# Patient Record
Sex: Female | Born: 1935 | Race: White | Hispanic: No | Smoking: Never smoker
Health system: Southern US, Community
[De-identification: ages and names within clinical notes are randomized; demographics above are authoritative.]

## PROBLEM LIST (undated history)

## (undated) DIAGNOSIS — E785 Hyperlipidemia, unspecified: Secondary | ICD-10-CM

## (undated) DIAGNOSIS — E079 Disorder of thyroid, unspecified: Secondary | ICD-10-CM

## (undated) DIAGNOSIS — J45909 Unspecified asthma, uncomplicated: Secondary | ICD-10-CM

## (undated) DIAGNOSIS — C50012 Malignant neoplasm of nipple and areola, left female breast: Secondary | ICD-10-CM

## (undated) HISTORY — DX: Malignant neoplasm of nipple and areola, left female breast: C50.012

## (undated) HISTORY — DX: Hyperlipidemia, unspecified: E78.5

## (undated) HISTORY — DX: Disorder of thyroid, unspecified: E07.9

---

## 1978-01-12 HISTORY — PX: MASTECTOMY: SHX3

## 2000-01-13 HISTORY — PX: INTRAOCULAR LENS IMPLANT, SECONDARY: SHX1842

## 2010-05-13 HISTORY — PX: INTERSTIM IMPLANT PLACEMENT: SHX5130

## 2010-05-13 HISTORY — PX: OTHER SURGICAL HISTORY: SHX169

## 2018-11-22 LAB — CBC AND DIFFERENTIAL
HCT: 37 (ref 36–46)
Hemoglobin: 12 (ref 12.0–16.0)
Platelets: 191 (ref 150–399)
WBC: 5.1

## 2018-11-22 LAB — LIPID PANEL
Cholesterol: 160 (ref 0–200)
HDL: 69 (ref 35–70)
LDL Cholesterol: 82
Triglycerides: 44 (ref 40–160)

## 2018-11-22 LAB — BASIC METABOLIC PANEL
BUN: 34 — AB (ref 4–21)
CO2: 24 — AB (ref 13–22)
Chloride: 110 — AB (ref 99–108)
Creatinine: 0.9 (ref 0.5–1.1)
Glucose: 70
Potassium: 4.5 (ref 3.4–5.3)
Sodium: 144 (ref 137–147)

## 2018-11-22 LAB — COMPREHENSIVE METABOLIC PANEL
Albumin: 3.5 (ref 3.5–5.0)
Calcium: 9.5 (ref 8.7–10.7)
GFR calc non Af Amer: 57

## 2018-11-22 LAB — CBC: RBC: 4.03 (ref 3.87–5.11)

## 2018-11-22 LAB — TSH: TSH: 2.27 (ref 0.41–5.90)

## 2019-07-20 ENCOUNTER — Telehealth: Payer: Self-pay

## 2019-07-20 NOTE — Telephone Encounter (Signed)
Told pt to clean around area with soft soap. Pt verbalized understanding.  KP

## 2019-07-20 NOTE — Telephone Encounter (Signed)
Copied from [place] [id number]. Topic: General - Inquiry >> Jul 20, 2019  9:14 AM Mathis Bud wrote: Reason for CRM: [name] patient daughter has a new patient appt 7/20.  Patient is requesting a new patient appt for today due to her catheter having blood in it.  Call back [phone]   Explained to care giver that we do not have a new pt appt today & that if there is an issue with the catheter then the ER may be her best bet.  She said she thinks she is fine & was wondering if she can clean the area in the abdomen where the catheter is with peroxide.

## 2019-07-31 ENCOUNTER — Other Ambulatory Visit: Payer: Self-pay

## 2019-07-31 ENCOUNTER — Telehealth: Payer: Self-pay

## 2019-07-31 DIAGNOSIS — D649 Anemia, unspecified: Secondary | ICD-10-CM | POA: Insufficient documentation

## 2019-07-31 DIAGNOSIS — E0590 Thyrotoxicosis, unspecified without thyrotoxic crisis or storm: Secondary | ICD-10-CM

## 2019-07-31 DIAGNOSIS — R339 Retention of urine, unspecified: Secondary | ICD-10-CM | POA: Insufficient documentation

## 2019-07-31 DIAGNOSIS — Z9359 Other cystostomy status: Secondary | ICD-10-CM | POA: Insufficient documentation

## 2019-07-31 DIAGNOSIS — J42 Unspecified chronic bronchitis: Secondary | ICD-10-CM | POA: Insufficient documentation

## 2019-07-31 DIAGNOSIS — K22719 Barrett's esophagus with dysplasia, unspecified: Secondary | ICD-10-CM

## 2019-07-31 DIAGNOSIS — E782 Mixed hyperlipidemia: Secondary | ICD-10-CM | POA: Insufficient documentation

## 2019-07-31 DIAGNOSIS — R413 Other amnesia: Secondary | ICD-10-CM

## 2019-07-31 DIAGNOSIS — I10 Essential (primary) hypertension: Secondary | ICD-10-CM | POA: Insufficient documentation

## 2019-07-31 DIAGNOSIS — E039 Hypothyroidism, unspecified: Secondary | ICD-10-CM | POA: Insufficient documentation

## 2019-07-31 DIAGNOSIS — E871 Hypo-osmolality and hyponatremia: Secondary | ICD-10-CM | POA: Insufficient documentation

## 2019-07-31 DIAGNOSIS — K2270 Barrett's esophagus without dysplasia: Secondary | ICD-10-CM | POA: Insufficient documentation

## 2019-07-31 DIAGNOSIS — E785 Hyperlipidemia, unspecified: Secondary | ICD-10-CM

## 2019-07-31 DIAGNOSIS — K219 Gastro-esophageal reflux disease without esophagitis: Secondary | ICD-10-CM | POA: Insufficient documentation

## 2019-07-31 NOTE — Telephone Encounter (Signed)
Called and spoke with pts legal guardian. Entered Allergies, Meds, and History before pt new patient appt.   KP

## 2019-08-01 ENCOUNTER — Ambulatory Visit (INDEPENDENT_AMBULATORY_CARE_PROVIDER_SITE_OTHER): Payer: Medicare Other | Admitting: Internal Medicine

## 2019-08-01 ENCOUNTER — Other Ambulatory Visit: Payer: Self-pay

## 2019-08-01 ENCOUNTER — Encounter: Payer: Self-pay | Admitting: Internal Medicine

## 2019-08-01 VITALS — BP 118/78 | HR 93 | Temp 98.2°F | Ht 63.0 in | Wt 149.0 lb

## 2019-08-01 DIAGNOSIS — F0390 Unspecified dementia without behavioral disturbance: Secondary | ICD-10-CM | POA: Insufficient documentation

## 2019-08-01 DIAGNOSIS — Z7189 Other specified counseling: Secondary | ICD-10-CM

## 2019-08-01 DIAGNOSIS — G301 Alzheimer's disease with late onset: Secondary | ICD-10-CM | POA: Diagnosis not present

## 2019-08-01 DIAGNOSIS — Z9359 Other cystostomy status: Secondary | ICD-10-CM

## 2019-08-01 DIAGNOSIS — R339 Retention of urine, unspecified: Secondary | ICD-10-CM

## 2019-08-01 DIAGNOSIS — F0280 Dementia in other diseases classified elsewhere without behavioral disturbance: Secondary | ICD-10-CM

## 2019-08-01 DIAGNOSIS — J410 Simple chronic bronchitis: Secondary | ICD-10-CM | POA: Diagnosis not present

## 2019-08-01 DIAGNOSIS — E039 Hypothyroidism, unspecified: Secondary | ICD-10-CM

## 2019-08-01 DIAGNOSIS — E785 Hyperlipidemia, unspecified: Secondary | ICD-10-CM

## 2019-08-01 DIAGNOSIS — K5900 Constipation, unspecified: Secondary | ICD-10-CM

## 2019-08-01 DIAGNOSIS — I10 Essential (primary) hypertension: Secondary | ICD-10-CM

## 2019-08-01 NOTE — Progress Notes (Signed)
Date:  08/01/2019   Name:  Morgan Schaefer   DOB:  November 12, 1925   MRN:  [id number]   Chief Complaint: Constipation, Suprapubic catheter (Needs urology referral. ), and FL2 (Needs FL2 form for [facility]. Stays with Legal Gardian. Going temp while Legal Gardian goes to [place]. )  Constipation This is a chronic (POA believes that she has had issues her whole life) problem. The problem has been waxing and waning since onset. Her stool frequency is 1 time per day (as long as she takes something.). The stool is described as formed. The patient is on a high fiber diet. She exercises regularly (recently started walking more often). There has been adequate water intake. Associated symptoms include fecal incontinence. Pertinent negatives include no abdominal pain, bloating, diarrhea, fever, melena or vomiting. Risk factors include immobility. Treatments tried: currently using Miralax.  Hypertension This is a chronic problem. The problem is controlled. Pertinent negatives include no chest pain, headaches or shortness of breath. Past treatments include calcium channel blockers. There are no compliance problems.  There is no history of kidney disease, CAD/MI, CVA or heart failure.  Hyperlipidemia This is a chronic problem. The problem is controlled. Pertinent negatives include no chest pain or shortness of breath. Current antihyperlipidemic treatment includes statins. The current treatment provides significant improvement of lipids.  Urinary Frequency  This is a chronic (urinary retention) problem. Associated symptoms include frequency. Pertinent negatives include no vomiting. Treatments tried: Interstim placed in 2014.  Suprapubic cath placed 4 months ago.  Dementia - determined to be Alzheimers dementia.  Patient is independent with dressing and feeding but needs assistance to bathe.  She is pleasant and cooperative, verbally interactive with no behavior issues.  Living with her POA now in  [place], moved from [facility].  She only has one relative - an 84 y/o step son in poor health out of state.  She has a DNR form from 2018 in [place].  Needs a new one for Jay.  Lab Results  Component Value Date   WBC 5.1 11/22/2018   HGB 12.0 11/22/2018   HCT 37 11/22/2018   PLT 191 11/22/2018   Lab Results  Component Value Date   CREATININE 0.9 11/22/2018   BUN 34 (A) 11/22/2018   NA 144 11/22/2018   K 4.5 11/22/2018   CL 110 (A) 11/22/2018   CO2 24 (A) 11/22/2018   Lab Results  Component Value Date   CHOL 160 11/22/2018   HDL 69 11/22/2018   LDLCALC 82 11/22/2018   TRIG 44 11/22/2018   Lab Results  Component Value Date   TSH 2.27 11/22/2018      Review of Systems  Constitutional: Negative for appetite change, diaphoresis, fever and unexpected weight change.  Respiratory: Positive for cough and wheezing (occasional). Negative for chest tightness and shortness of breath.   Cardiovascular: Negative for chest pain and leg swelling.  Gastrointestinal: Positive for constipation. Negative for abdominal pain, bloating, diarrhea, melena and vomiting.  Genitourinary: Positive for frequency.       Urinary incontinence  Musculoskeletal: Positive for gait problem (uses rolator for balance). Negative for arthralgias.  Skin: Negative for color change and rash.  Allergic/Immunologic: Positive for environmental allergies.  Neurological: Negative for dizziness, light-headedness and headaches.  Psychiatric/Behavioral: Positive for confusion. Negative for dysphoric mood and sleep disturbance. The patient is not nervous/anxious.     Patient Active Problem List   Diagnosis Date Noted   Dementia ([place]) 08/01/2019   Benign essential HTN 07/31/2019  Hyperlipidemia, mixed 07/31/2019   Hypothyroidism (acquired) 07/31/2019   Urinary retention 07/31/2019   Suprapubic catheter ([place]) 07/31/2019   Hyponatremia 07/31/2019   Barrett's esophagus 07/31/2019   Anemia 07/31/2019   Memory loss 07/31/2019    Chronic bronchitis ([place]) 07/31/2019   GERD (gastroesophageal reflux disease) 07/31/2019    Allergies  Allergen Reactions   Ciprofloxacin Other (See Comments)    Unkown   Penicillins Other (See Comments)    unknown   Sulfa Antibiotics Other (See Comments)    UNKNOWN    Past Surgical History:  Procedure Laterality Date   INTERSTIM IMPLANT PLACEMENT  05/2010   INTRAOCULAR LENS IMPLANT, SECONDARY  2002   MASTECTOMY Right 1980    Social History   Tobacco Use   Smoking status: Never Smoker   Smokeless tobacco: Never Used  Vaping Use   Vaping Use: Never used  Substance Use Topics   Alcohol use: Not Currently   Drug use: Not Currently     Medication list has been reviewed and updated.  Current Meds  Medication Sig   amLODipine (NORVASC) 5 MG tablet Take 5 mg by mouth daily.   aspirin EC 81 MG tablet Take 81 mg by mouth daily. Swallow whole.   fluticasone-salmeterol (ADVAIR HFA) 115-21 MCG/ACT inhaler Inhale 2 puffs into the lungs 2 (two) times daily.   guaiFENesin (MUCINEX) 600 MG 12 hr tablet Take 1 tablet by mouth 2 (two) times daily as needed.    levothyroxine (SYNTHROID) 25 MCG tablet Take 25 mcg by mouth daily before breakfast.   loratadine (CLARITIN) 10 MG tablet Take 10 mg by mouth daily.   montelukast (SINGULAIR) 10 MG tablet Take 10 mg by mouth at bedtime.   Multiple Vitamins-Minerals (MULTIVITAMINS THER. W/MINERALS) TABS tablet Take 1 tablet by mouth daily.   Multiple Vitamins-Minerals (OCUVITE ADULT 50+ PO) Take 1 capsule by mouth daily.   nystatin cream (MYCOSTATIN) Apply 1 application topically 2 (two) times daily.   pantoprazole (PROTONIX) 40 MG tablet Take 40 mg by mouth daily.   polyethylene glycol (MIRALAX / GLYCOLAX) 17 g packet Take 17 g by mouth 3 (three) times a week.   saccharomyces boulardii (FLORASTOR) 250 MG capsule Take 250 mg by mouth 2 (two) times daily.   simvastatin (ZOCOR) 20 MG tablet Take 20 mg by mouth daily.      PHQ 2/9 Scores 08/01/2019  PHQ - 2 Score 0  PHQ- 9 Score 0    GAD 7 : Generalized Anxiety Score 08/01/2019  Nervous, Anxious, on Edge 0  Control/stop worrying 0  Worry too much - different things 0  Trouble relaxing 0  Restless 0  Easily annoyed or irritable 0  Afraid - awful might happen 0  Total GAD 7 Score 0  Anxiety Difficulty Not difficult at all    BP Readings from Last 3 Encounters:  08/01/19 118/78    Physical Exam Vitals and nursing note reviewed.  Constitutional:      General: She is not in acute distress.    Appearance: Normal appearance. She is well-developed.  HENT:     Head: Normocephalic and atraumatic.     Right Ear: Decreased hearing noted.     Left Ear: Decreased hearing noted.  Neck:     Thyroid: No thyromegaly or thyroid tenderness.     Vascular: No carotid bruit.  Cardiovascular:     Rate and Rhythm: Normal rate and regular rhythm. Occasional extrasystoles are present.    Pulses:  Radial pulses are 1+ on the right side and 1+ on the left side.       Dorsalis pedis pulses are 1+ on the right side and 1+ on the left side.     Heart sounds: Normal heart sounds. No murmur heard.   Pulmonary:     Effort: Pulmonary effort is normal. No respiratory distress.     Breath sounds: Normal breath sounds and air entry. No decreased breath sounds, wheezing or rhonchi.  Abdominal:     General: Abdomen is flat. Bowel sounds are normal.     Palpations: Abdomen is soft.       Comments: Suprapubic cath draining clear yellow urine  Musculoskeletal:        General: Normal range of motion.     Cervical back: Normal range of motion.     Right hip: Normal.     Left hip: Normal.     Right knee: Normal.     Left knee: Normal.     Right lower leg: No edema.     Left lower leg: No edema.  Lymphadenopathy:     Cervical: No cervical adenopathy.  Skin:    General: Skin is warm and dry.     Capillary Refill: Capillary refill takes less than 2 seconds.      Findings: No rash.  Neurological:     General: No focal deficit present.     Mental Status: She is alert.     Sensory: Sensation is intact.     Gait: Gait abnormal (uses rolator).  Psychiatric:        Attention and Perception: Attention normal.        Mood and Affect: Mood normal.        Speech: Speech normal.        Cognition and Memory: Memory is impaired.     Wt Readings from Last 3 Encounters:  08/01/19 149 lb (67.6 kg)    BP 118/78    Pulse 93    Temp 98.2 F (36.8 C) (Oral)    Ht 5\' 3"  (1.6 m)    Wt 149 lb (67.6 kg)    SpO2 93%    BMI 26.39 kg/m   Assessment and Plan: 1. Benign essential HTN Clinically stable exam with well controlled BP. Tolerating medications without side effects at this time. Pt to continue current regimen and low sodium diet; benefits of regular exercise as able discussed. - CBC with Differential/Platelet - Comprehensive metabolic panel  2. Suprapubic catheter [facility]) Site is clear.  Continue local care and refer to Urology - Ambulatory referral to Urology  3. Simple chronic bronchitis (HCC) Pt is on an appropriate regimen with minimal symptoms Will continue Advair, mucinex, Claritin and singulair  4. Late onset Alzheimer's disease without behavioral disturbance [facility]) Now living with her POA and friend [place] Needs respite for a vacation in August - FL2 for [facility] completed today  5. Hypothyroidism (acquired) Supplemented; check labs and advise - TSH + free T4  6. Urinary retention S/p Interstim placement and recently suprapubic catheter - Ambulatory referral to Urology  7. DNR (do not resuscitate) discussion When asked about DNR/rescusitation patient stated "what for?" Previous form reviewed. New DNR for [place] completed  8. Hyperlipidemia, unspecified hyperlipidemia type - Lipid panel  9. Constipation, unspecified constipation type Continue daily Miralax - adjust dose to achieve 1-2 soft stools per day   Partially dictated  using Editor, commissioning. Any errors are unintentional.  Halina Maidens, MD [facility]  08/01/2019

## 2019-08-02 LAB — CBC WITH DIFFERENTIAL/PLATELET
Basophils Absolute: 0 10*3/uL (ref 0.0–0.2)
Basos: 0 %
EOS (ABSOLUTE): 0.2 10*3/uL (ref 0.0–0.4)
Eos: 2 %
Hematocrit: 42.3 % (ref 34.0–46.6)
Hemoglobin: 14.3 g/dL (ref 11.1–15.9)
Immature Grans (Abs): 0 10*3/uL (ref 0.0–0.1)
Immature Granulocytes: 0 %
Lymphocytes Absolute: 1.6 10*3/uL (ref 0.7–3.1)
Lymphs: 21 %
MCH: 29.4 pg (ref 26.6–33.0)
MCHC: 33.8 g/dL (ref 31.5–35.7)
MCV: 87 fL (ref 79–97)
Monocytes Absolute: 0.5 10*3/uL (ref 0.1–0.9)
Monocytes: 6 %
Neutrophils Absolute: 5.4 10*3/uL (ref 1.4–7.0)
Neutrophils: 71 %
Platelets: 267 10*3/uL (ref 150–450)
RBC: 4.86 x10E6/uL (ref 3.77–5.28)
RDW: 13.3 % (ref 11.7–15.4)
WBC: 7.8 10*3/uL (ref 3.4–10.8)

## 2019-08-02 LAB — COMPREHENSIVE METABOLIC PANEL
ALT: 10 IU/L (ref 0–32)
AST: 14 IU/L (ref 0–40)
Albumin/Globulin Ratio: 1.9 (ref 1.2–2.2)
Albumin: 4.5 g/dL (ref 3.5–4.6)
Alkaline Phosphatase: 83 IU/L (ref 48–121)
BUN/Creatinine Ratio: 18 (ref 12–28)
BUN: 19 mg/dL (ref 10–36)
Bilirubin Total: 0.3 mg/dL (ref 0.0–1.2)
CO2: 24 mmol/L (ref 20–29)
Calcium: 10 mg/dL (ref 8.7–10.3)
Chloride: 102 mmol/L (ref 96–106)
Creatinine, Ser: 1.03 mg/dL — ABNORMAL HIGH (ref 0.57–1.00)
GFR calc Af Amer: 55 mL/min/{1.73_m2} — ABNORMAL LOW (ref >59)
GFR calc non Af Amer: 48 mL/min/{1.73_m2} — ABNORMAL LOW (ref >59)
Globulin, Total: 2.4 g/dL (ref 1.5–4.5)
Glucose: 89 mg/dL (ref 65–99)
Potassium: 4.7 mmol/L (ref 3.5–5.2)
Sodium: 140 mmol/L (ref 134–144)
Total Protein: 6.9 g/dL (ref 6.0–8.5)

## 2019-08-02 LAB — LIPID PANEL
Chol/HDL Ratio: 2.6 ratio (ref 0.0–4.4)
Cholesterol, Total: 199 mg/dL (ref 100–199)
HDL: 78 mg/dL (ref >39)
LDL Chol Calc (NIH): 94 mg/dL (ref 0–99)
Triglycerides: 159 mg/dL — ABNORMAL HIGH (ref 0–149)
VLDL Cholesterol Cal: 27 mg/dL (ref 5–40)

## 2019-08-02 LAB — TSH+FREE T4
Free T4: 1.44 ng/dL (ref 0.82–1.77)
TSH: 3.83 u[IU]/mL (ref 0.450–4.500)

## 2019-08-08 ENCOUNTER — Encounter: Payer: Self-pay | Admitting: Urology

## 2019-08-08 ENCOUNTER — Other Ambulatory Visit: Payer: Self-pay

## 2019-08-08 ENCOUNTER — Ambulatory Visit (INDEPENDENT_AMBULATORY_CARE_PROVIDER_SITE_OTHER): Payer: Medicare Other | Admitting: Urology

## 2019-08-08 VITALS — BP 128/78 | HR 78 | Ht 63.0 in | Wt 148.0 lb

## 2019-08-08 DIAGNOSIS — R339 Retention of urine, unspecified: Secondary | ICD-10-CM | POA: Diagnosis not present

## 2019-08-08 NOTE — Progress Notes (Signed)
   08/08/19 3:27 PM   Morgan Schaefer 1925-09-06 [id number]  CC: Suprapubic tube/urinary retention  HPI: I saw Morgan Schaefer in urology clinic today for urinary retention managed with a suprapubic tube.  She is a 86 year old female with significant Alzheimer's disease who lives with her guardian Morgan Schaefer who is here with her today.  Her outside records are unavailable to me.  She reportedly had an InterStim placed in 2014 for unclear indications, and had been on CIC long-term for urinary retention.  She recently had a suprapubic tube placed in March 2021 in [place].  This was reportedly placed that she was having difficulty catheterizing and getting recurrent urinary infections secondary to nonsterile CIC technique.  Her suprapubic tube has apparently resolved all her infectious UTI issues.  This has been changed on a monthly basis by home health.  She is now lives in the area here, and they are being seen to establish care for SP tube changes.  She has minimal leakage from below, and is not bothered by any incontinence at this time.   Family History: Family History  Problem Relation Age of Onset  . Heart disease Mother   . Heart disease Father     Social History:  reports that she has never smoked. She has never used smokeless tobacco. She reports previous alcohol use. She reports previous drug use.  Physical Exam: BP 128/78   Pulse 78   Ht 5\' 3"  (1.6 m)   Wt 148 lb (67.1 kg)   BMI 26.22 kg/m    Constitutional: Pleasantly confused Cardiovascular: No clubbing, cyanosis, or edema. Respiratory: Normal respiratory effort, no increased work of breathing. GI: Abdomen is soft, nontender, nondistended, no abdominal masses  Procedure: The old suprapubic tube was removed.  The site was prepped and draped in standard sterile fashion, and a new 9 French catheter was advanced into the bladder with return of yellow urine, and 10 cc were placed in the balloon.  This was connected to a  new leg bag and secured to the left thigh.  Assessment & Plan:   In summary, she is a 86 year old female with reported chronic urinary retention previously managed by CIC, and transitioned to a suprapubic tube in March 2021 by an outside urologist in [facility].  They are presenting to establish care for suprapubic tube monitoring and care.  I changed her suprapubic tube today, and we discussed the need for ongoing monthly changes.  Return precautions were discussed at length.  RTC 1 month with PA for SP tube change  Nickolas Madrid, MD 08/08/2019  [facility] [address] [place], [place] [address] [phone]

## 2019-08-08 NOTE — Patient Instructions (Signed)
Suprapubic Catheter Home Guide °A suprapubic catheter is a flexible tube that is used to drain urine from the bladder into a collection bag outside the body. The catheter is inserted into the bladder through a small opening in the lower abdomen, above the pubic bone (suprapubic area) and a few inches below your belly button (navel). A tiny balloon filled with germ-free (sterile) water helps to keep the catheter in place. °The collection bag must be emptied at least once a day and cleaned at least every other day. The collection bag can be put beside your bed at night and attached to your leg during the day. You may have a large collection bag to use at night and a smaller one to use during the day. °Your suprapubic catheter may need to be changed every 4-6 weeks, or as often as recommended by your health care provider. Healing of the tract where the catheter is placed can take 6 weeks to 6 months. During that time, your health care provider may change your catheter. Once the tract is well healed, you or a caregiver will change your suprapubic catheter at home. °What are the risks? °This catheter is safe to use. However, problems can occur, including: °· Blocked urine flow. This can occur if the catheter stops working, or if you have a blood clot in your bladder or in the catheter. °· Irritation of the skin around the catheter. °· Infection. This can happen if bacteria gets into your bladder. °Supplies needed: °· Two pairs of sterile gloves. °· Paper towels. °· Catheter. °· Two syringes. °· Sterile water. °· Sterile cleaning solution. °· Lubricant. °· Collection bags. °How to change the catheter ° °1. Drink plenty of fluids during the hours before you change the catheter. °2. Wash your hands with soap and water. If soap and water are not available, use hand sanitizer. °3. Draw up sterile water into a syringe to have ready to fill the new catheter balloon. The amount will depend on the size of the balloon. °4. Have  all of your supplies ready and close to you on a paper towel. °5. Lie on your back, sitting slightly upright so that you can see the catheter and opening. °6. Put on sterile gloves. °7. Clean the skin around the catheter opening using the sterile cleaning solution. °8. Remove the water from the balloon in the catheter using a syringe. °9. Slowly remove the catheter. If the catheter seems stuck, or if you have difficulty removing it: °? Do not pull on it. °? Call your health care provider right away. °10. Place the old catheter on a paper towel to discard later. °11. Take off the used gloves, and put on a new pair. °12. Put lubricant on the end of the new catheter that will go into your bladder. °13. Clean the skin around the catheter opening using the sterile cleaning solution. °14. Gently slide the catheter through the opening in your abdomen and into the tract that leads to your bladder. °15. Wait for some urine to start flowing through the catheter. °16. When urine starts to flow through the catheter, attach the collection bag to the end of the catheter. Make sure the connection is tight. °17. Use a syringe to fill the catheter balloon with sterile water. Fill to the amount directed by your health care provider. °18. Remove the gloves and wash your hands with soap and water. °How to care for the skin around the catheter °Follow your health care provider's instructions on   caring for your skin. °· Use a clean washcloth and soapy water to clean the skin around your catheter every day. Pat the area dry with a clean paper towel. °· Do not pull on the catheter. °· Do not use ointment or lotion on this area, unless told by your health care provider. °· Check the skin around the catheter every day for signs of infection. Check for: °? Redness, swelling, or pain. °? Fluid or blood. °? Warmth. °? Pus or a bad smell. °How to empty and clean the collection bag °Empty the large collection bag every 8 hours. Empty the small  collection bag when it is about ? full. Clean the collection bag every 2-3 days, or as often as told by your health care provider. To do this: °1. Wash your hands with soap and water. If soap and water are not available, use hand sanitizer. °2. Disconnect the bag from the catheter and immediately attach a new bag to the catheter. °3. Hold the used bag over the toilet or another container. °4. Turn the valve (spigot) at the bottom of the bag to empty the urine. Empty the used bag completely. °? Do not touch the opening of the spigot. °? Do not let the opening touch the toilet or container. °5. Close the spigot tightly when the bag is empty. °6. Clean the used bag in one of the following methods: °? According to the manufacturer's instructions. °? As told by your health care provider. °7. Let the bag dry completely. Put it in a clean plastic bag before storing it. °General tips ° °· Always wash your hands before and after caring for your catheter and collection bag. Use a mild, fragrance-free soap. If soap and water are not available, use hand sanitizer. °· Clean the outside of the catheter with soap and water as often as told by your health care provider. °· Always make sure there are no twists or kinks in the catheter tube. °· Always make sure there are no leaks in the catheter or collection bag. °· Always wear the leg bag below your knee. °· Make sure the overnight drainage bag is always lower than the level of your bladder, but do not let it touch the floor. Before you go to sleep, hang the bag inside a wastebasket that is covered by a clean plastic bag. °· Drink enough fluid to keep your urine pale yellow. °· Do not take baths, swim, or use a hot tub until your health care provider approves. Ask your health care provider if you may take showers. °Contact a heath care provider if: °· You leak urine. °· You have redness, swelling, or pain around your catheter. °· You have fluid or blood coming from your catheter  opening. °· Your catheter opening feels warm to the touch. °· You have pus or a bad smell coming from your catheter opening. °· You have a fever or chills. °· Your urine flow slows down. °· Your urine becomes cloudy or smelly. °Get help right away if: °· Your catheter comes out. °· You have: °? Nausea. °? Back pain. °? Difficulty changing your catheter. °? Blood in your urine. °? No urine flow for 1 hour. °Summary °· A suprapubic catheter is a flexible tube that is used to drain urine from the bladder into a collection bag outside the body. °· Your suprapubic catheter may need to be changed every 4-6 weeks, or as recommended by your health care provider. °· Follow instructions on how to   change the catheter and how to empty and clean the collection bag. °· Always wash your hands before and after caring for your catheter and collection bag. Drink enough fluid to keep your urine pale yellow. °· Get help right away if you have difficulty changing your catheter or if there is blood in your urine. °This information is not intended to replace advice given to you by your health care provider. Make sure you discuss any questions you have with your health care provider. °Document Revised: 04/21/2018 Document Reviewed: 02/02/2018 °Elsevier Patient Education © 2020 [employer] Inc[employer] ° °

## 2019-08-09 ENCOUNTER — Telehealth: Payer: Self-pay | Admitting: Internal Medicine

## 2019-08-09 NOTE — Telephone Encounter (Unsigned)
Copied from [place] [id number]. Topic: General - Inquiry >> Aug 09, 2019  2:05 PM Scherrie Gerlach wrote: Reason for CRM: [name], [place] for the pt, states [facility] assisted living called her and said some changes need to be made on the FL2 1. Where is says resident level of care, needs to be ALF.  (not ASS) 2. Remove the nystatin cream (MYCOSTATIN) it was d'cd.  Please correct and send back to them.

## 2019-08-09 NOTE — Telephone Encounter (Signed)
Fixed the Fl2 form as stated above and refaxed.  CM

## 2019-08-17 ENCOUNTER — Ambulatory Visit
Admission: RE | Admit: 2019-08-17 | Discharge: 2019-08-17 | Disposition: A | Discharge status: Home / Self Care | Payer: Medicare Other | Source: Ambulatory Visit | Attending: Emergency Medicine | Admitting: Emergency Medicine

## 2019-08-17 ENCOUNTER — Other Ambulatory Visit: Payer: Self-pay

## 2019-08-17 DIAGNOSIS — Z111 Encounter for screening for respiratory tuberculosis: Secondary | ICD-10-CM

## 2019-08-17 MED ORDER — TUBERCULIN PPD 5 UNIT/0.1ML ID SOLN
5.0000 [IU] | Freq: Once | INTRADERMAL | Status: DC
  Administered 2019-08-17: 5 [IU] via INTRADERMAL

## 2019-08-17 NOTE — ED Triage Notes (Signed)
Patient states that she is here for a TB Skin test for nursing home placement.

## 2019-09-03 NOTE — Progress Notes (Signed)
Suprapubic Cath Change  Patient is present today for a suprapubic catheter change due to urinary retention with her guardian, [name].  9 ml of water was drained from the balloon, a 16 FR foley cath was removed from the tract with out difficulty.  Site was cleaned and prepped in a sterile fashion with betadine.  A 16 FR foley cath was replaced into the tract no complications were noted. Urine return was noted, 10 ml of sterile water was inflated into the balloon and a leg bag was attached for drainage.  Patient tolerated well.   Performed by: Zara Council, PA-C   Follow up: One month for SPT exchange

## 2019-09-04 ENCOUNTER — Encounter: Payer: Self-pay | Admitting: Urology

## 2019-09-04 ENCOUNTER — Ambulatory Visit (INDEPENDENT_AMBULATORY_CARE_PROVIDER_SITE_OTHER): Payer: Medicare Other | Admitting: Urology

## 2019-09-04 ENCOUNTER — Other Ambulatory Visit: Payer: Self-pay

## 2019-09-04 VITALS — BP 129/77 | HR 98 | Ht 63.0 in | Wt 146.0 lb

## 2019-09-04 DIAGNOSIS — R339 Retention of urine, unspecified: Secondary | ICD-10-CM

## 2019-09-20 ENCOUNTER — Other Ambulatory Visit: Payer: Self-pay

## 2019-09-20 ENCOUNTER — Encounter: Payer: Self-pay | Admitting: Urology

## 2019-09-20 ENCOUNTER — Ambulatory Visit (INDEPENDENT_AMBULATORY_CARE_PROVIDER_SITE_OTHER): Payer: Medicare Other | Admitting: Urology

## 2019-09-20 VITALS — BP 136/83 | HR 96 | Ht 63.0 in | Wt 140.0 lb

## 2019-09-20 DIAGNOSIS — R339 Retention of urine, unspecified: Secondary | ICD-10-CM | POA: Diagnosis not present

## 2019-09-20 NOTE — Progress Notes (Signed)
   09/20/2019 1:41 PM   Morgan Schaefer 06-21-1925 [id number]  Reason for visit: Suprapubic tube problem  HPI: I saw [name] and her caregiver Morgan Schaefer today as an add-on for a suprapubic tube problem.  She is a 86 year old female with significant Alzheimer's dementia who lives with her guardian Morgan Schaefer.  Her outside urology records are unavailable to me, but she apparently had been on CIC long-term for urinary retention, but a suprapubic tube was placed in March 2021 in [facility] as she was having difficulty catheterizing and getting recurrent infections due to nonsterile CIC technique.  The suprapubic tube has apparently resolved all her UTI issues.  The suprapubic tube was last changed by Zara Council on 09/04/2019 without issue.  Apparently yesterday, she accidentally pulled on her suprapubic tube and was having less output into the bag, as well as some leakage from her urethra.  Her guardian Morgan Schaefer was able to push it back in, and today it has been draining yellow urine into the bag fine.  PVR in clinic today 0 mL.  She denies any fevers or chills.  On exam, the suprapubic tube appears to be in place with yellow urine in the bag.  There is no drainage around the suprapubic tube.  Reassurance was provided, PVR of 0 indicates suprapubic tube is in appropriate position and draining well.  We discussed return precautions.  I recommended continuing follow-up with PA on a monthly basis for SP tube changes, next change would be 2 weeks from now.  Billey Co, [facility] [address], [place], [place] [address]  [phone]

## 2019-09-21 ENCOUNTER — Ambulatory Visit: Payer: Medicare Other | Admitting: Urology

## 2019-10-08 NOTE — Progress Notes (Signed)
Suprapubic Cath Change  Patient is present today for a suprapubic catheter change due to urinary retention.  9 ml of water was drained from the balloon, a 16 FR foley cath was removed from the tract with out difficulty.  It is clogged with sediment.  Site was cleaned and prepped in a sterile fashion with betadine.  The area around the SPT site has a rash that appears to be candidal in nature.   A 16 FR foley cath was replaced into the tract no complications were noted. Urine return was noted, 10 ml of sterile water was inflated into the balloon and a leg bag was attached for drainage.  Patient tolerated well.   Performed by: Zara Council, PA-C  Follow up: One month for SPT exchange  Patient continues to have issues with leakage around the SPT.  It is unknown whether she is forgetting to tighten the port after she empties the leg bag at night, but there has been leakage around the SPT site and in the depends.  As the catheter was found to have some sediment, I have instructed them to institute vinegar irrigation of the bladder to keep the sediment down.  I have given him the instructions for the vinegar irrigation solution and advised to irrigate at least 3 times weekly or more if they note the catheter is becoming clogged.  It is also important to make sure the SPT is not on retention, but there is slack in the Foley tubing.  I spent 15 minutes on the day of the encounter to include pre-visit record review, face-to-face time with the patient, and post-visit ordering of tests.

## 2019-10-09 ENCOUNTER — Ambulatory Visit (INDEPENDENT_AMBULATORY_CARE_PROVIDER_SITE_OTHER): Payer: Medicare Other | Admitting: Urology

## 2019-10-09 ENCOUNTER — Other Ambulatory Visit: Payer: Self-pay

## 2019-10-09 ENCOUNTER — Ambulatory Visit: Payer: Medicare Other | Admitting: Urology

## 2019-10-09 ENCOUNTER — Encounter: Payer: Self-pay | Admitting: Urology

## 2019-10-09 VITALS — BP 129/82 | HR 92 | Ht 63.0 in | Wt 145.0 lb

## 2019-10-09 DIAGNOSIS — R339 Retention of urine, unspecified: Secondary | ICD-10-CM | POA: Diagnosis not present

## 2019-10-09 MED ORDER — NYSTATIN 100000 UNIT/GM EX CREA
1.0000 | TOPICAL_CREAM | Freq: Two times a day (BID) | CUTANEOUS | 0 refills | Status: DC
Start: 2019-10-09 — End: 2020-01-16

## 2019-10-09 NOTE — Patient Instructions (Addendum)
Vinegar Bladder Irrigation Protocol patient education  Patient's on intermittent catheterization with chronic bacteriuria and/or chronic bladder stones, irrigating the bladder with a dilute vinegar solution can be beneficial.  The recommended concentration is 0.25% acetic acid. Most grocery stores carry white vinegar as a 5% solution.  Therefore, to make appropriate bladder irrigations, it needs to be diluted at a ratio of roughly 20:1.  To achieve this, see the chart below to determine what amount of 5% white vinegar solution you should mix with your normal bladder irrigation (homemade saline or sterile sodium chloride from the pharmacy).  Amount of 5% White Vinegar Solution to Mix In: If Your Normal Bladder Irrigation Amount Is: 2.5 teaspoons (12.5 mL) 250 mL irrigation 5 teaspoons (25 mL) 500 mL irrigation 10 teaspoons (50 mL) 1000 mL irrigation About 6 ounces 1 gallon irrigation  It might be helpful to remember:  One teaspoon = 5 mL  One tablespoon = 15 mL  Irrigate the bladder with this solution using the volumes and techniques you've discussed with your health care provider. In certain circumstances, your provider might instruct you to leave some irrigation in the bladder for a period of time to dissolve debris/mucous. Discontinue irrigation if it causes pain or discomfort. Do not use this solution if you believe your child has an acute urinary tract infection.  

## 2019-10-13 ENCOUNTER — Ambulatory Visit: Payer: Self-pay

## 2019-10-13 NOTE — Telephone Encounter (Signed)
Patient guardian [name] called stating that patient got OOB this am with C/O chills.  She took her temperature and it was 101.3.  Guardian states that the patient went back to bed.  She is now awake and is denying any symptoms. Guardian states that this week they had her super pubic catheter changed at the urologist office. She rechecked temperature and it is now 100.8.  She has not had tylenol or fever reducing medication. [name] states that her urne output is normal The skin at the Super pubic site is not red. She did mention that the urologist stated that there was a fungus growing around the catheter and she was given a topical medication to apply. Per protocol [name] was asked to take the patient to UC.  She is unsure she wants to take her out where she will be exposed. She was told that patient needs seen today. She should call urologist. She verbalized understadning and will got her medical evaluation today. She is just going to explore options for virtual care. Care advice was read to [facility]  She verbalized understanding. Reason for Disposition . [1] Fever > 101 F (38.3 C) AND [2] age > 60 years  Answer Assessment - Initial Assessment Questions 1. TEMPERATURE: "What is the most recent temperature?"  "How was it measured?"      101.3 2. ONSET: "When did the fever start?"      today 3. SYMPTOMS: "Do you have any other symptoms besides the fever?"  (e.g., colds, headache, sore throat, earache, cough, rash, diarrhea, vomiting, abdominal pain)    none 4. CAUSE: If there are no symptoms, ask: "What do you think is causing the fever?"      unsure 5. CONTACTS: "Does anyone else in the family have an infection?"    no 6. TREATMENT: "What have you done so far to treat this fever?" (e.g., medications)     none 7. IMMUNOCOMPROMISE: "Do you have of the following: diabetes, HIV positive, splenectomy, cancer chemotherapy, chronic steroid treatment, transplant patient, etc."     Sp catheter 8.  PREGNANCY: "Is there any chance you are pregnant?" "When was your last menstrual period?"   N/A 9. TRAVEL: "Have you traveled out of the country in the last month?" (e.g., travel history, exposures)    No  Protocols used: FEVER-A-AH

## 2019-10-16 NOTE — Telephone Encounter (Signed)
Noted FYI.  CM

## 2019-10-26 ENCOUNTER — Ambulatory Visit (INDEPENDENT_AMBULATORY_CARE_PROVIDER_SITE_OTHER): Payer: Medicare Other

## 2019-10-26 ENCOUNTER — Other Ambulatory Visit: Payer: Self-pay

## 2019-10-26 DIAGNOSIS — R339 Retention of urine, unspecified: Secondary | ICD-10-CM

## 2019-10-26 NOTE — Progress Notes (Signed)
Patient present today complaining of cath not draining. Unable to irrigate with vinegar solution at home. Per Larene Beach cath was to be exchanged today and up sized to help with sediment clogging catheter. 50fr to 18FR  foley Diet was also discussed at visit. Patient is only drinking 8oz of water daily with OJ, milk and ensure. It was recommended to increase water intake and add lemon to water to help with encrustation It was also discussed to irrigation with vinegar solution 3 times weekly to avoid encrustation.  Patient's daughter was present at today's visit and is in agreement with plan  Suprapubic Cath Change  Patient is present today for a suprapubic catheter change due to urinary retention.  78ml of water was drained from the balloon, a 16FR foley cath was removed from the tract with out difficulty.  Site was cleaned and prepped in a sterile fashion with betadine.  A 18FR foley cath was replaced into the tract no complications were noted. Urine return was noted, 10 ml of sterile water was inflated into the balloon and a leg bag was attached for drainage.  Patient tolerated well.   Preformed by: Alva Garnet  Follow up: 2wks with Larene Beach for next exchange unless draining well then apt can be moved further out

## 2019-10-26 NOTE — Telephone Encounter (Signed)
Patient's caregiver called requesting vinegar solution directions be sent. This was sent via mychart. Caregiver called back stating that she is unable to irrigate catheter. Patient was added on to the nurse schedule today for irrigation/foley exchange

## 2019-10-26 NOTE — Patient Instructions (Signed)
Increase water intake: 3 8oz glasses or more daily with lemon   Vinegar Irrigation 3 times a week: Monday, Wednesday & Friday  Keep current follow up, can move appointment out to a later date if cath is draining well

## 2019-11-06 ENCOUNTER — Ambulatory Visit: Payer: Medicare Other | Admitting: Urology

## 2019-11-15 NOTE — Progress Notes (Signed)
Suprapubic Cath Change Patient is present today for a suprapubic catheter change due to urinary retention.  8 ml of water was drained from the balloon, a 18 FR foley cath was removed from the tract with out difficulty.  Site was cleaned and prepped in a sterile fashion with betadine.  A 18 FR foley cath was replaced into the tract no complications were noted. Urine return was noted, 10 ml of sterile water was inflated into the balloon and a leg bag was attached for drainage.  Patient tolerated well.   Performed by: Zara Council, PA-C and Kerman Passey, CMA  Follow up: Four weeks for SPT exchange

## 2019-11-16 ENCOUNTER — Ambulatory Visit (INDEPENDENT_AMBULATORY_CARE_PROVIDER_SITE_OTHER): Payer: Medicare Other | Admitting: Urology

## 2019-11-16 ENCOUNTER — Other Ambulatory Visit: Payer: Self-pay

## 2019-11-16 DIAGNOSIS — R339 Retention of urine, unspecified: Secondary | ICD-10-CM

## 2019-11-28 ENCOUNTER — Encounter: Payer: Self-pay | Admitting: Internal Medicine

## 2019-11-28 ENCOUNTER — Other Ambulatory Visit: Payer: Self-pay

## 2019-11-28 ENCOUNTER — Ambulatory Visit (INDEPENDENT_AMBULATORY_CARE_PROVIDER_SITE_OTHER): Payer: Medicare Other | Admitting: Internal Medicine

## 2019-11-28 VITALS — BP 130/82 | HR 72 | Temp 97.5°F | Ht 62.0 in | Wt 142.0 lb

## 2019-11-28 DIAGNOSIS — L03313 Cellulitis of chest wall: Secondary | ICD-10-CM

## 2019-11-28 DIAGNOSIS — I10 Essential (primary) hypertension: Secondary | ICD-10-CM

## 2019-11-28 DIAGNOSIS — C50912 Malignant neoplasm of unspecified site of left female breast: Secondary | ICD-10-CM | POA: Diagnosis not present

## 2019-11-28 MED ORDER — DOXYCYCLINE HYCLATE 100 MG PO TABS: 100.0000 mg | ORAL_TABLET | Freq: Two times a day (BID) | ORAL | 0 refills | Status: AC

## 2019-11-28 NOTE — Progress Notes (Signed)
Date:  11/28/2019   Name:  Morgan Schaefer   DOB:  1925/08/08   MRN:  [id number]   Chief Complaint: Left Nipple Pain  Nipple redness - she was seen about three weeks ago by Dermatology.  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  She was instructed to use a topical cream and return in 3 months.  However, the nipple is becoming more inflamed and tender.  She is taking tylenol 650 mg every 4-6 hours.  She was not referred to Oncology. She has remote history of right breast cancer s/p mastectomy.  Lab Results  Component Value Date   CREATININE 1.03 (H) 08/01/2019   BUN 19 08/01/2019   NA 140 08/01/2019   K 4.7 08/01/2019   CL 102 08/01/2019   CO2 24 08/01/2019   Lab Results  Component Value Date   CHOL 199 08/01/2019   HDL 78 08/01/2019   LDLCALC 94 08/01/2019   TRIG 159 (H) 08/01/2019   CHOLHDL 2.6 08/01/2019   Lab Results  Component Value Date   TSH 3.830 08/01/2019   No results found for: HGBA1C Lab Results  Component Value Date   WBC 7.8 08/01/2019   HGB 14.3 08/01/2019   HCT 42.3 08/01/2019   MCV 87 08/01/2019   PLT 267 08/01/2019   Lab Results  Component Value Date   ALT 10 08/01/2019   AST 14 08/01/2019   ALKPHOS 83 08/01/2019   BILITOT 0.3 08/01/2019     Review of Systems  Constitutional: Negative for appetite change, chills, fatigue and fever.  Respiratory: Negative for chest tightness and shortness of breath.   Cardiovascular: Negative for chest pain.  Skin: Positive for color change and wound.  Psychiatric/Behavioral: Negative for dysphoric mood and sleep disturbance. The patient is not nervous/anxious.     Patient Active Problem List   Diagnosis Date Noted  . Dementia ([place]) 08/01/2019  . Constipation 08/01/2019  . Benign essential HTN 07/31/2019  . Hyperlipidemia, mixed 07/31/2019  . Hypothyroidism (acquired) 07/31/2019  . Urinary retention 07/31/2019  . Suprapubic catheter ([place]) 07/31/2019  . Hyponatremia 07/31/2019  .  Barrett's esophagus 07/31/2019  . Anemia 07/31/2019  . Memory loss 07/31/2019  . Chronic bronchitis ([place]) 07/31/2019  . GERD (gastroesophageal reflux disease) 07/31/2019    Allergies  Allergen Reactions  . Ciprofloxacin Other (See Comments)    Unkown  . Penicillins Other (See Comments)    unknown  . Sulfa Antibiotics Other (See Comments)    UNKNOWN    Past Surgical History:  Procedure Laterality Date  . INTERSTIM IMPLANT PLACEMENT  05/2010  . INTRAOCULAR LENS IMPLANT, SECONDARY  2002  . MASTECTOMY Right 1980    Social History   Tobacco Use  . Smoking status: Never Smoker  . Smokeless tobacco: Never Used  Vaping Use  . Vaping Use: Never used  Substance Use Topics  . Alcohol use: Not Currently  . Drug use: Not Currently     Medication list has been reviewed and updated.  Current Meds  Medication Sig  . amLODipine (NORVASC) 5 MG tablet Take 5 mg by mouth daily.  Marland Kitchen aspirin EC 81 MG tablet Take 81 mg by mouth daily. Swallow whole.  . fluticasone-salmeterol (ADVAIR HFA) 115-21 MCG/ACT inhaler Inhale 2 puffs into the lungs 2 (two) times daily.  Marland Kitchen guaiFENesin (MUCINEX) 600 MG 12 hr tablet Take 1 tablet by mouth 2 (two) times daily as needed.   Marland Kitchen levothyroxine (SYNTHROID) 25 MCG tablet Take 25 mcg by  mouth daily before breakfast.  . loratadine (CLARITIN) 10 MG tablet Take 10 mg by mouth daily.  . montelukast (SINGULAIR) 10 MG tablet Take 10 mg by mouth at bedtime.  . Multiple Vitamins-Minerals (MULTIVITAMINS THER. W/MINERALS) TABS tablet Take 1 tablet by mouth daily.  . Multiple Vitamins-Minerals (OCUVITE ADULT 50+ PO) Take 1 capsule by mouth daily.  Marland Kitchen nystatin cream (MYCOSTATIN) Apply 1 application topically 2 (two) times daily.  . pantoprazole (PROTONIX) 40 MG tablet Take 40 mg by mouth daily.  . polyethylene glycol (MIRALAX / GLYCOLAX) 17 g packet Take 17 g by mouth 3 (three) times a week.  . saccharomyces boulardii (FLORASTOR) 250 MG capsule Take 250 mg by mouth 2  (two) times daily.  . simvastatin (ZOCOR) 20 MG tablet Take 20 mg by mouth daily.    PHQ 2/9 Scores 08/01/2019  PHQ - 2 Score 0  PHQ- 9 Score 0    GAD 7 : Generalized Anxiety Score 08/01/2019  Nervous, Anxious, on Edge 0  Control/stop worrying 0  Worry too much - different things 0  Trouble relaxing 0  Restless 0  Easily annoyed or irritable 0  Afraid - awful might happen 0  Total GAD 7 Score 0  Anxiety Difficulty Not difficult at all    BP Readings from Last 3 Encounters:  11/28/19 130/82  10/09/19 129/82  09/20/19 136/83    Physical Exam Constitutional:      Appearance: Normal appearance.  HENT:     Right Ear: Decreased hearing noted.     Left Ear: Decreased hearing noted.  Cardiovascular:     Rate and Rhythm: Normal rate and regular rhythm.  Pulmonary:     Effort: Pulmonary effort is normal.     Breath sounds: No wheezing or rhonchi.  Chest:     Breasts:        Right: Absent.        Left: Swelling (of nipple with redness/ lateral bx site with eschar and yellow exudate), nipple discharge and tenderness present.  Musculoskeletal:     Cervical back: Normal range of motion.  Lymphadenopathy:     Cervical: No cervical adenopathy.  Neurological:     Mental Status: She is alert.     Wt Readings from Last 3 Encounters:  11/28/19 142 lb (64.4 kg)  10/09/19 145 lb (65.8 kg)  09/20/19 140 lb (63.5 kg)    BP 130/82   Pulse 72   Temp (!) 97.5 F (36.4 C) (Oral)   Ht 5\' 2"  (1.575 m)   Wt 142 lb (64.4 kg)   SpO2 97%   BMI 25.97 kg/m   Assessment and Plan: 1. 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 Continue warm compresses and tylenol every 4-6 hours as needed Will refer to Oncology for further evaluation - Ambulatory referral to Oncology  2. Cellulitis of chest wall Appears to be some bacterial infection at the biopsy site Continue topical medication and treat with Doxy - doxycycline  (VIBRA-TABS) 100 MG tablet; Take 1 tablet (100 mg total) by mouth 2 (two) times daily for 10 days.  Dispense: 20 tablet; Refill: 0  3. Benign essential HTN Controlled BP on current medication - amlodipine 5 mg  The information and plan was relayed to [employer] by phone and to [employer] who accompanied the patient today.  Partially dictated using Editor, commissioning. Any errors are unintentional.  Halina Maidens, MD [facility]  11/28/2019

## 2019-12-04 ENCOUNTER — Other Ambulatory Visit: Payer: Self-pay | Admitting: Hematology and Oncology

## 2019-12-10 NOTE — Progress Notes (Signed)
[facility]  [address] [place], [place] [address] Phone: [phone]  Fax: [phone]   Clinic Day:  12/11/2019  Referring physician: Glean Hess, MD  Chief Complaint: Morgan Schaefer is a 86 y.o. 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 Halina Maidens for assessment and management.   HPI:  The patient was diagnosed with right breast cancer (no details available).  She presented with an abnormal mammogram.  She underwent right mastectomy in 1980 while living in [place]. She does not remember anything about the size of the tumor, stage, or receptor status.   She was seen by dermatology in late 10/2019. Left areola biopsy on 11/07/2019 by Dr. Darlis Loan revealed mammary Paget's disease. No invasive process was seen. She was instructed to use a topical cream and follow up in 3 months.   She was seen by Dr. Army Melia on 11/28/2019 for left nipple pain. Despite the topical cream prescribed by dermatology, the nipple became more inflamed and tender. She was taking Tylenol 650 mg q 4-6 hours.  Exam revealed swelling of the nipple with redness, tenderness and discharge.  The lateral biopsy site had an eschar and yellow exudate. She was instructed to use warm compresses.  She was felt to have cellulitis of the chest wall.  She was prescribed doxycycline 100 mg po BID.  She was referred to oncology.  Symptomatically, she has felt "ok".  She finished antibiotics 2 days ago and feels that it helped. She has a chronic runny nose and allergies. When she moves her arm, she feels a "crunching" sensation. She reports poor balance but denies any recent falls.  She denies fevers, sweats, headaches, changes in vision, sore throat, cough, shortness of breath, chest pain, palpitations, nausea, vomiting, diarrhea, reflux, skin changes, numbness, weakness, and bleeding of any kind.  The patient has  Alzheimer's dementia that has worsened over the past 5 years. She was driving 2 years ago. She is able to perform her ADLs independently. She moves around the house throughout the day and sometimes walks around the block.  She had a suprapubic catheter placed in [place] in 03/2019 for urinary retention. She had difficulty with self catheterization (non-sterile technique and recurrent infections).  The patient is unaware of her last mammogram. She does not remember when her first period was or when she went through menopause. She has never been pregnant.  She denies a family history of blood disorders or cancer.   Past Medical History:  Diagnosis Date  . Hyperlipidemia   . Paget disease of breast, left ([place])   . Thyroid disease     Past Surgical History:  Procedure Laterality Date  . INTERSTIM IMPLANT PLACEMENT  05/2010  . INTRAOCULAR LENS IMPLANT, SECONDARY  2002  . MASTECTOMY Right 1980    Family History  Problem Relation Age of Onset  . Heart disease Mother   . Heart disease Father     Social History:  reports that she has never smoked. She has never used smokeless tobacco. She reports previous alcohol use. She reports previous drug use. She used to drink but does not anymore. She denies a history of tobacco use. She denies exposure to radiation or toxins. 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 She grew up in [place], then moved to [place] and stayed in assisted living, then moved to [place] to live with [place].  She is widowed.  The patient lives with her guardian and POA, [name]. She is accompanied by [name] today.  Allergies:  Allergies  Allergen Reactions  . Ciprofloxacin Other (See Comments)    Unkown Other reaction(s): Unknown  . Penicillins Other (See Comments)    unknown  . Sulfa Antibiotics Other (See Comments)    UNKNOWN    Current Medications: Current Outpatient Medications  Medication Sig Dispense Refill  . amLODipine (NORVASC)  5 MG tablet Take 5 mg by mouth daily.    Marland Kitchen aspirin EC 81 MG tablet Take 81 mg by mouth daily. Swallow whole.    . fluticasone-salmeterol (ADVAIR HFA) 115-21 MCG/ACT inhaler Inhale 2 puffs into the lungs 2 (two) times daily.    Marland Kitchen guaiFENesin (MUCINEX) 600 MG 12 hr tablet Take 1 tablet by mouth 2 (two) times daily as needed.     Marland Kitchen levothyroxine (SYNTHROID) 25 MCG tablet Take 25 mcg by mouth daily before breakfast.    . loratadine (CLARITIN) 10 MG tablet Take 10 mg by mouth daily.    . montelukast (SINGULAIR) 10 MG tablet Take 10 mg by mouth at bedtime.    . Multiple Vitamins-Minerals (MULTIVITAMINS THER. W/MINERALS) TABS tablet Take 1 tablet by mouth daily.    . Multiple Vitamins-Minerals (OCUVITE ADULT 50+ PO) Take 1 capsule by mouth daily.    Marland Kitchen nystatin cream (MYCOSTATIN) Apply 1 application topically 2 (two) times daily. 30 g 0  . pantoprazole (PROTONIX) 40 MG tablet Take 40 mg by mouth daily.    . polyethylene glycol (MIRALAX / GLYCOLAX) 17 g packet Take 17 g by mouth 3 (three) times a week.    . saccharomyces boulardii (FLORASTOR) 250 MG capsule Take 250 mg by mouth 2 (two) times daily.    . simvastatin (ZOCOR) 20 MG tablet Take 20 mg by mouth daily.     No current facility-administered medications for this visit.    Review of Systems  Constitutional: Negative for chills, diaphoresis, fever, malaise/fatigue and weight loss (reports weight gain).  HENT: Negative for congestion, ear discharge, ear pain, hearing loss, nosebleeds, sinus pain, sore throat and tinnitus.        Chronic runny nose.  Eyes: Negative for blurred vision.       S/p cataract surgery.  Respiratory: Negative for cough, hemoptysis, sputum production and shortness of breath.   Cardiovascular: Negative for chest pain, palpitations and leg swelling.  Gastrointestinal: Negative for abdominal pain, blood in stool, constipation, diarrhea, heartburn, melena, nausea and vomiting.  Genitourinary: Negative for dysuria, frequency,  hematuria and urgency.       Suprapubic catheter.  Musculoskeletal: Negative for back pain, falls, joint pain, myalgias and neck pain.       Feels a "crunching" sensation when she moves her arm.  Skin: Negative for itching and rash.  Neurological: Negative for dizziness, tingling, sensory change, weakness and headaches.       Poor balance.  Endo/Heme/Allergies: Positive for environmental allergies. Does not bruise/bleed easily.  Psychiatric/Behavioral: Positive for memory loss (Alzheimer's). Negative for depression. The patient is not nervous/anxious and does not have insomnia.   All other systems reviewed and are negative.  Performance status (ECOG): 2  Vitals Blood pressure 134/70, pulse 83, temperature (!) 97.1 F (36.2 C), temperature source Tympanic, resp. rate 18, weight 146 lb 11.5 oz (66.6 kg), SpO2 96 %.   Physical Exam Vitals and nursing note reviewed.  Constitutional:      General: She is not in acute distress.    Appearance: She is not  diaphoretic.     Comments: Rolling walker by her side. Patient was examined in a chair.  HENT:     Head: Normocephalic and atraumatic.     Comments: Short gray hair.    Mouth/Throat:     Mouth: Mucous membranes are moist.     Pharynx: Oropharynx is clear.  Eyes:     General: No scleral icterus.    Extraocular Movements: Extraocular movements intact.     Conjunctiva/sclera: Conjunctivae normal.     Pupils: Pupils are equal, round, and reactive to light.     Comments: Glasses.  Cardiovascular:     Rate and Rhythm: Normal rate and regular rhythm.     Heart sounds: Normal heart sounds. No murmur heard.   Pulmonary:     Effort: Pulmonary effort is normal. No respiratory distress.     Breath sounds: Normal breath sounds. No wheezing or rales.  Chest:     Chest wall: No tenderness.  Breasts:     Right: Absent. No axillary adenopathy or supraclavicular adenopathy.     Left: Tenderness (superiorly) present. No inverted nipple, mass,  skin change, axillary adenopathy or supraclavicular adenopathy.      Comments: Right mastectomy without erythema or nodularity.  Scattered fibrocystic changes in the left breast.  Left nipple changes (see photo). Abdominal:     General: Bowel sounds are normal. There is no distension.     Palpations: Abdomen is soft. There is no mass.     Tenderness: There is no abdominal tenderness. There is no guarding or rebound.  Musculoskeletal:        General: Tenderness (right iliac crest) present. No swelling. Normal range of motion.     Cervical back: Normal range of motion and neck supple.  Lymphadenopathy:     Head:     Right side of head: No preauricular, posterior auricular or occipital adenopathy.     Left side of head: No preauricular, posterior auricular or occipital adenopathy.     Cervical: No cervical adenopathy.     Upper Body:     Right upper body: No supraclavicular or axillary adenopathy.     Left upper body: No supraclavicular or axillary adenopathy.     Lower Body: No right inguinal adenopathy. No left inguinal adenopathy.  Skin:    General: Skin is warm and dry.  Neurological:     Mental Status: She is alert and oriented to person, place, and time.  Psychiatric:        Behavior: Behavior normal.        Thought Content: Thought content normal.        Judgment: Judgment normal.     12/11/2019: Left Breast    Appointment on 12/11/2019  Component Date Value Ref Range Status  . CA 27.29 12/11/2019 22.2  0.0 - 38.6 U/mL Final   Comment: (NOTE) Siemens Centaur Immunochemiluminometric Methodology [facility]) Values obtained with different assay methods or kits cannot be used interchangeably. Results cannot be interpreted as absolute evidence of the presence or absence of malignant disease. Performed At: [facility] [place], [place] [id number] Rush Farmer MD [id number]   . Sodium 12/11/2019 139  135 - 145 mmol/L Final  . Potassium 12/11/2019 4.5   3.5 - 5.1 mmol/L Final  . Chloride 12/11/2019 105  98 - 111 mmol/L Final  . CO2 12/11/2019 25  22 - 32 mmol/L Final  . Glucose, Bld 12/11/2019 92  70 - 99 mg/dL Final   Glucose reference range applies only  to samples taken after fasting for at least 8 hours.  . BUN 12/11/2019 32* 8 - 23 mg/dL Final  . Creatinine, Ser 12/11/2019 1.00  0.44 - 1.00 mg/dL Final  . Calcium 12/11/2019 9.6  8.9 - 10.3 mg/dL Final  . Total Protein 12/11/2019 7.3  6.5 - 8.1 g/dL Final  . Albumin 12/11/2019 4.1  3.5 - 5.0 g/dL Final  . AST 12/11/2019 19  15 - 41 U/L Final  . ALT 12/11/2019 13  0 - 44 U/L Final  . Alkaline Phosphatase 12/11/2019 72  38 - 126 U/L Final  . Total Bilirubin 12/11/2019 0.6  0.3 - 1.2 mg/dL Final  . GFR, Estimated 12/11/2019 53* >60 mL/min Final   Comment: (NOTE) Calculated using the CKD-EPI Creatinine Equation (2021)   . Anion gap 12/11/2019 9  5 - 15 Final   Performed at [facility], [address], [place], [place] [address]  . WBC 12/11/2019 8.0  4.0 - 10.5 K/uL Final  . RBC 12/11/2019 4.65  3.87 - 5.11 MIL/uL Final  . Hemoglobin 12/11/2019 13.9  12.0 - 15.0 g/dL Final  . HCT 12/11/2019 42.9  36.0 - 46.0 % Final  . MCV 12/11/2019 92.3  80.0 - 100.0 fL Final  . MCH 12/11/2019 29.9  26.0 - 34.0 pg Final  . MCHC 12/11/2019 32.4  30.0 - 36.0 g/dL Final  . RDW 12/11/2019 14.7  11.5 - 15.5 % Final  . Platelets 12/11/2019 267  150 - 400 K/uL Final  . nRBC 12/11/2019 0.0  0.0 - 0.2 % Final  . Neutrophils Relative % 12/11/2019 66  % Final  . Neutro Abs 12/11/2019 5.3  1.7 - 7.7 K/uL Final  . Lymphocytes Relative 12/11/2019 24  % Final  . Lymphs Abs 12/11/2019 1.9  0.7 - 4.0 K/uL Final  . Monocytes Relative 12/11/2019 8  % Final  . Monocytes Absolute 12/11/2019 0.7  0.1 - 1.0 K/uL Final  . Eosinophils Relative 12/11/2019 2  % Final  . Eosinophils Absolute 12/11/2019 0.2  0.0 - 0.5 K/uL Final  . Basophils Relative 12/11/2019 0  % Final  . Basophils Absolute 12/11/2019  0.0  0.0 - 0.1 K/uL Final  . Immature Granulocytes 12/11/2019 0  % Final  . Abs Immature Granulocytes 12/11/2019 0.02  0.00 - 0.07 K/uL Final   Performed at [facility], [address] [place], [place] [address]    Assessment:  Annarae Macnair is a 86 y.o. female with Paget's disease of left breast.  Biopsy of the left nipple on 11/07/2019 revealed Paget's disease.  She has a history of right breast cancer (no details available) s/p right mastectomy in 1980.    She has Alzheimer's dementia.  She has a suprapubic catheter for urinary retention.  Catheter was placed in 03/2019 in [place] secondary to difficulty with self catheterization (non-sterile technique and recurrent infections).  Symptomatically, she feels "ok".  She recently completed a course of antibiotics for cellulitis of the left breast.  Plan: 1.   Labs today:  CBC with diff, CMP, CA27.29. 2.   Paget's disease of left nipple  Discuss Paget's disease- breast cancer in the epidermis of the nipple areolar complex.  Discuss typical presentation with itching, bleeding, or eczema of the nipple.  Discuss Paget's is typically associated with DCIS or invasive cancer in the breast (80-90%).  Obtain pathology from dermatology.  Bilateral diagnostic mammogram.  If lesion noted on mammogram, discuss plan for biopsy.  If Paget's alone, will  require surgery (lumpectomy + radiation or mastectomy).  If Paget's associated with DCIS or invasive breast cancer will need additional treatment as dictated by underlying breast cancer.  Several questions asked and answered. 3.   Diagnostic left mammogram and ultrasound. 4.   Consult surgery (Dr Bary Castilla). 5.   RTC in 10 days for MD assessment, review of work-up and discussion regarding direction of therapy.   I discussed the assessment and treatment plan with the patient.  The patient was provided an opportunity to ask questions and all were answered.  The patient agreed with the plan  and demonstrated an understanding of the instructions.  The patient was advised to call back if the symptoms worsen or if the condition fails to improve as anticipated.  I provided 33 minutes of face-to-face time during this this encounter and > 50% was spent counseling as documented under my assessment and plan. An additional 10 minutes were spent reviewing her chart (Epic and Care Everywhere) including notes, labs, and imaging studies.    Shine Mikes C. Mike Gip, MD, PhD    12/11/2019, 6:17 PM   I, Mirian Mo Tufford, am acting as Education administrator for [employer]. Mike Gip, MD, PhD.  I, Nikkita Adeyemi C. Mike Gip, MD, have reviewed the above documentation for accuracy and completeness, and I agree with the above.

## 2019-12-11 ENCOUNTER — Telehealth: Payer: Self-pay

## 2019-12-11 ENCOUNTER — Inpatient Hospital Stay: Payer: Medicare Other

## 2019-12-11 ENCOUNTER — Encounter: Payer: Self-pay | Admitting: Hematology and Oncology

## 2019-12-11 ENCOUNTER — Inpatient Hospital Stay: Payer: Medicare Other | Attending: Hematology and Oncology | Admitting: Hematology and Oncology

## 2019-12-11 ENCOUNTER — Other Ambulatory Visit: Payer: Self-pay

## 2019-12-11 VITALS — BP 134/70 | HR 83 | Temp 97.1°F | Resp 18 | Wt 146.7 lb

## 2019-12-11 DIAGNOSIS — C50012 Malignant neoplasm of nipple and areola, left female breast: Secondary | ICD-10-CM | POA: Diagnosis present

## 2019-12-11 LAB — CBC WITH DIFFERENTIAL/PLATELET
Abs Immature Granulocytes: 0.02 10*3/uL (ref 0.00–0.07)
Basophils Absolute: 0 10*3/uL (ref 0.0–0.1)
Basophils Relative: 0 %
Eosinophils Absolute: 0.2 10*3/uL (ref 0.0–0.5)
Eosinophils Relative: 2 %
HCT: 42.9 % (ref 36.0–46.0)
Hemoglobin: 13.9 g/dL (ref 12.0–15.0)
Immature Granulocytes: 0 %
Lymphocytes Relative: 24 %
Lymphs Abs: 1.9 10*3/uL (ref 0.7–4.0)
MCH: 29.9 pg (ref 26.0–34.0)
MCHC: 32.4 g/dL (ref 30.0–36.0)
MCV: 92.3 fL (ref 80.0–100.0)
Monocytes Absolute: 0.7 10*3/uL (ref 0.1–1.0)
Monocytes Relative: 8 %
Neutro Abs: 5.3 10*3/uL (ref 1.7–7.7)
Neutrophils Relative %: 66 %
Platelets: 267 10*3/uL (ref 150–400)
RBC: 4.65 MIL/uL (ref 3.87–5.11)
RDW: 14.7 % (ref 11.5–15.5)
WBC: 8 10*3/uL (ref 4.0–10.5)
nRBC: 0 % (ref 0.0–0.2)

## 2019-12-11 LAB — COMPREHENSIVE METABOLIC PANEL
ALT: 13 U/L (ref 0–44)
AST: 19 U/L (ref 15–41)
Albumin: 4.1 g/dL (ref 3.5–5.0)
Alkaline Phosphatase: 72 U/L (ref 38–126)
Anion gap: 9 (ref 5–15)
BUN: 32 mg/dL — ABNORMAL HIGH (ref 8–23)
CO2: 25 mmol/L (ref 22–32)
Calcium: 9.6 mg/dL (ref 8.9–10.3)
Chloride: 105 mmol/L (ref 98–111)
Creatinine, Ser: 1 mg/dL (ref 0.44–1.00)
GFR, Estimated: 53 mL/min — ABNORMAL LOW (ref >60)
Glucose, Bld: 92 mg/dL (ref 70–99)
Potassium: 4.5 mmol/L (ref 3.5–5.1)
Sodium: 139 mmol/L (ref 135–145)
Total Bilirubin: 0.6 mg/dL (ref 0.3–1.2)
Total Protein: 7.3 g/dL (ref 6.5–8.1)

## 2019-12-11 NOTE — Progress Notes (Signed)
Patient here for oncology follow-up appointment, occupied by family friend/caregiver, expresses  complaints of Left breast redness/ swelling and urinary retention with suprapubic foley.

## 2019-12-11 NOTE — Progress Notes (Signed)
Referral paperwork faxed to Dr. Dwyane Luo office.

## 2019-12-12 ENCOUNTER — Encounter: Payer: Self-pay | Admitting: Dermatology

## 2019-12-12 LAB — CANCER ANTIGEN 27.29: CA 27.29: 22.2 U/mL (ref 0.0–38.6)

## 2019-12-19 ENCOUNTER — Ambulatory Visit (INDEPENDENT_AMBULATORY_CARE_PROVIDER_SITE_OTHER): Payer: Medicare Other | Admitting: Urology

## 2019-12-19 ENCOUNTER — Other Ambulatory Visit: Payer: Self-pay

## 2019-12-19 ENCOUNTER — Encounter: Payer: Self-pay | Admitting: Urology

## 2019-12-19 ENCOUNTER — Telehealth: Payer: Self-pay | Admitting: Hematology and Oncology

## 2019-12-19 VITALS — BP 140/85 | HR 97 | Ht 62.0 in | Wt 146.0 lb

## 2019-12-19 DIAGNOSIS — R339 Retention of urine, unspecified: Secondary | ICD-10-CM | POA: Diagnosis not present

## 2019-12-19 NOTE — Progress Notes (Signed)
Suprapubic Cath Change Patient is present today for a suprapubic catheter change due to the SPT clogged with sediment.  8 ml of water was drained from the balloon, a 18 FR foley cath was removed from the tract with out difficulty.  Site was cleaned and prepped in a sterile fashion with betadine.  A 18 FR foley cath was replaced into the tract no complications were noted. Urine return was noted, 10 ml of sterile water was inflated into the balloon and a leg bag was attached for drainage.  Patient tolerated well.   Performed by: Zara Council, PA-C  Follow up: One month for SPT exchange

## 2019-12-21 ENCOUNTER — Ambulatory Visit: Payer: Medicare Other | Admitting: Urology

## 2019-12-21 ENCOUNTER — Telehealth: Payer: Self-pay | Admitting: Hematology and Oncology

## 2019-12-21 ENCOUNTER — Ambulatory Visit: Payer: Medicare Other | Admitting: Hematology and Oncology

## 2019-12-21 NOTE — Telephone Encounter (Signed)
I called her daughter and left VM and explained her follow up has to be after mammogram. Left message to call me back.

## 2019-12-25 ENCOUNTER — Ambulatory Visit: Payer: Medicare Other | Admitting: Urology

## 2019-12-26 ENCOUNTER — Inpatient Hospital Stay: Payer: Medicare Other | Admitting: Hematology and Oncology

## 2019-12-29 ENCOUNTER — Other Ambulatory Visit: Payer: Self-pay

## 2019-12-29 ENCOUNTER — Ambulatory Visit
Admission: RE | Admit: 2019-12-29 | Discharge: 2019-12-29 | Disposition: A | Discharge status: Home / Self Care | Payer: Medicare Other | Source: Ambulatory Visit | Attending: Hematology and Oncology | Admitting: Hematology and Oncology

## 2019-12-29 DIAGNOSIS — C50012 Malignant neoplasm of nipple and areola, left female breast: Secondary | ICD-10-CM

## 2020-01-01 ENCOUNTER — Other Ambulatory Visit: Payer: Self-pay

## 2020-01-01 ENCOUNTER — Telehealth: Payer: Self-pay

## 2020-01-01 ENCOUNTER — Ambulatory Visit (INDEPENDENT_AMBULATORY_CARE_PROVIDER_SITE_OTHER): Payer: Medicare Other | Admitting: Urology

## 2020-01-01 ENCOUNTER — Encounter: Payer: Self-pay | Admitting: Urology

## 2020-01-01 VITALS — BP 146/87 | HR 56 | Ht 62.0 in | Wt 147.0 lb

## 2020-01-01 DIAGNOSIS — R399 Unspecified symptoms and signs involving the genitourinary system: Secondary | ICD-10-CM

## 2020-01-01 DIAGNOSIS — Z978 Presence of other specified devices: Secondary | ICD-10-CM

## 2020-01-01 NOTE — Telephone Encounter (Signed)
Spoke with provider who states pt needs to come into the office immediately. Caregiver gave verbal understanding. Will go to [facility] clinic.

## 2020-01-01 NOTE — Telephone Encounter (Signed)
Incoming message on triage line from pt's caregiver stating that patient's catheter is clogged and not draining into bag. She states that she has tried to flush the cath with no success.

## 2020-01-01 NOTE — Progress Notes (Incomplete)
[facility]  [address], [address] [place], [place] [address] Phone: [phone]  Fax: [phone]   Clinic Day:  01/01/2020  Referring physician: Glean Hess, MD  Chief Complaint: Morgan Schaefer is a 86 y.o. female with Paget's disease of the left nipple who is seen for review of work-up and discussion regarding direction of therapy.  HPI: The patient was last seen in the medical oncology clinic on 12/11/2019 for new patient assessment. At that time, she described a 6 week history of changes in her left nipple. 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  We discussed imaging and referral to surgery for excision (lumpectomy vs mastectomy).  Hematocrit was 42.9, hemoglobin 13.9, platelets 267,000, WBC 8,000. Creatinine was 1.00 (CrCl 53 ml/min). CA27.29 was 22.2.   The patient was seen in consultation by Dr. Bary Castilla on 12/21/2019. Surgical options of breast conservation vs mastectomy were discussed.  The pros and cons of mammography were discussed.  The patient was going to consider her options and likely proceed with mammography.  She was felt not to require radiation if excision performed given her age and mental status.  Left diagnostic mammogram on 12/29/2019 revealed an indeterminate 8 mm focal asymmetry with associated coarse heterogeneous calcifications in the central anterior left breast without a sonographic correlate. There was no evidence of left axillary lymphadenopathy.  Symptomatically, ***   Past Medical History:  Diagnosis Date  . Hyperlipidemia   . Paget disease of breast, left ([place])   . Thyroid disease     Past Surgical History:  Procedure Laterality Date  . INTERSTIM IMPLANT PLACEMENT  05/2010  . INTRAOCULAR LENS IMPLANT, SECONDARY  2002  . MASTECTOMY Right 1980    Family History  Problem Relation Age of Onset  . Heart disease Mother   . Heart disease Father      Social History:  reports that she has never smoked. She has never used smokeless tobacco. She reports previous alcohol use. She reports previous drug use.  She used to drink but does not anymore. She denies a history of tobacco use. She denies exposure to radiation or toxins. 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 She grew up in [place], then moved to [place] and stayed in assisted living, then moved to [place] to live with [place]. The patient lives with her guardian and POA, [name]. She is accompanied by [name] today.  Allergies:  Allergies  Allergen Reactions  . Ciprofloxacin Other (See Comments)    Unkown  . Penicillins Other (See Comments)    unknown  . Sulfa Antibiotics Other (See Comments)    UNKNOWN    Current Medications: Current Outpatient Medications  Medication Sig Dispense Refill  . amLODipine (NORVASC) 5 MG tablet Take 5 mg by mouth daily.    Marland Kitchen aspirin EC 81 MG tablet Take 81 mg by mouth daily. Swallow whole.    . fluticasone-salmeterol (ADVAIR HFA) 115-21 MCG/ACT inhaler Inhale 2 puffs into the lungs 2 (two) times daily.    Marland Kitchen guaiFENesin (MUCINEX) 600 MG 12 hr tablet Take 1 tablet by mouth 2 (two) times daily as needed.     Marland Kitchen levothyroxine (SYNTHROID) 25 MCG tablet Take 25 mcg by mouth daily before breakfast.    . loratadine (CLARITIN) 10 MG tablet Take 10 mg by mouth daily.    . montelukast (SINGULAIR) 10 MG tablet Take 10 mg by mouth at bedtime.    . Multiple  Vitamins-Minerals (MULTIVITAMINS THER. W/MINERALS) TABS tablet Take 1 tablet by mouth daily.    . Multiple Vitamins-Minerals (OCUVITE ADULT 50+ PO) Take 1 capsule by mouth daily.    Marland Kitchen nystatin cream (MYCOSTATIN) Apply 1 application topically 2 (two) times daily. 30 g 0  . pantoprazole (PROTONIX) 40 MG tablet Take 40 mg by mouth daily.    . polyethylene glycol (MIRALAX / GLYCOLAX) 17 g packet Take 17 g by mouth 3 (three) times a week.    . saccharomyces boulardii (FLORASTOR) 250 MG capsule Take  250 mg by mouth 2 (two) times daily.    . simvastatin (ZOCOR) 20 MG tablet Take 20 mg by mouth daily.     No current facility-administered medications for this visit.    Review of Systems  Constitutional: Negative for chills, diaphoresis, fever, malaise/fatigue and weight loss.  HENT: Negative for congestion, ear discharge, ear pain, hearing loss, nosebleeds, sinus pain, sore throat and tinnitus.        Chronic runny nose  Eyes: Negative for blurred vision.  Respiratory: Negative for cough, hemoptysis, sputum production and shortness of breath.   Cardiovascular: Negative for chest pain, palpitations and leg swelling.  Gastrointestinal: Negative for abdominal pain, blood in stool, constipation, diarrhea, heartburn, melena, nausea and vomiting.  Genitourinary: Negative for dysuria, frequency, hematuria and urgency.       Suprapubic catheter  Musculoskeletal: Negative for back pain, joint pain, myalgias and neck pain.       Feels a "crunching" sensation when she moves her arm   Skin: Negative for itching and rash.  Neurological: Negative for dizziness, tingling, sensory change, weakness and headaches.       Poor balance  Endo/Heme/Allergies: Positive for environmental allergies. Does not bruise/bleed easily.  Psychiatric/Behavioral: Positive for memory loss (Alzheimer's). Negative for depression. The patient is not nervous/anxious and does not have insomnia.   All other systems reviewed and are negative.  Performance status (ECOG): ***  Vitals There were no vitals taken for this visit.   Physical Exam Vitals and nursing note reviewed.  Constitutional:      General: She is not in acute distress.    Appearance: She is not diaphoretic.  HENT:     Head: Normocephalic and atraumatic.     Mouth/Throat:     Mouth: Mucous membranes are moist.     Pharynx: Oropharynx is clear.  Eyes:     General: No scleral icterus.    Extraocular Movements: Extraocular movements intact.      Conjunctiva/sclera: Conjunctivae normal.     Pupils: Pupils are equal, round, and reactive to light.  Cardiovascular:     Rate and Rhythm: Normal rate and regular rhythm.     Heart sounds: Normal heart sounds. No murmur heard.   Pulmonary:     Effort: Pulmonary effort is normal. No respiratory distress.     Breath sounds: Normal breath sounds. No wheezing or rales.  Chest:     Chest wall: No tenderness.  Breasts:     Right: Absent. No axillary adenopathy or supraclavicular adenopathy.     Left: Skin change (scattered fibrocystic changes) and tenderness (superiorly) present. No axillary adenopathy or supraclavicular adenopathy.    Abdominal:     General: Bowel sounds are normal. There is no distension.     Palpations: Abdomen is soft. There is no mass.     Tenderness: There is no abdominal tenderness. There is no guarding or rebound.  Musculoskeletal:        General: No swelling or  tenderness. Normal range of motion.     Cervical back: Normal range of motion and neck supple.     Comments: Right iliac crest tenderness  Lymphadenopathy:     Head:     Right side of head: No preauricular, posterior auricular or occipital adenopathy.     Left side of head: No preauricular, posterior auricular or occipital adenopathy.     Cervical: No cervical adenopathy.     Upper Body:     Right upper body: No supraclavicular or axillary adenopathy.     Left upper body: No supraclavicular or axillary adenopathy.     Lower Body: No right inguinal adenopathy. No left inguinal adenopathy.  Skin:    General: Skin is warm and dry.  Neurological:     Mental Status: She is alert and oriented to person, place, and time.  Psychiatric:        Behavior: Behavior normal.        Thought Content: Thought content normal.        Judgment: Judgment normal.     No visits with results within 3 Day(s) from this visit.  Latest known visit with results is:  Appointment on 12/11/2019  Component Date Value Ref  Range Status  . CA 27.29 12/11/2019 22.2  0.0 - 38.6 U/mL Final   Comment: (NOTE) Siemens Centaur Immunochemiluminometric Methodology [facility]) Values obtained with different assay methods or kits cannot be used interchangeably. Results cannot be interpreted as absolute evidence of the presence or absence of malignant disease. Performed At: [facility] [place], [place] [id number] Rush Farmer MD WU:[id number]   . Sodium 12/11/2019 139  135 - 145 mmol/L Final  . Potassium 12/11/2019 4.5  3.5 - 5.1 mmol/L Final  . Chloride 12/11/2019 105  98 - 111 mmol/L Final  . CO2 12/11/2019 25  22 - 32 mmol/L Final  . Glucose, Bld 12/11/2019 92  70 - 99 mg/dL Final   Glucose reference range applies only to samples taken after fasting for at least 8 hours.  . BUN 12/11/2019 32* 8 - 23 mg/dL Final  . Creatinine, Ser 12/11/2019 1.00  0.44 - 1.00 mg/dL Final  . Calcium 12/11/2019 9.6  8.9 - 10.3 mg/dL Final  . Total Protein 12/11/2019 7.3  6.5 - 8.1 g/dL Final  . Albumin 12/11/2019 4.1  3.5 - 5.0 g/dL Final  . AST 12/11/2019 19  15 - 41 U/L Final  . ALT 12/11/2019 13  0 - 44 U/L Final  . Alkaline Phosphatase 12/11/2019 72  38 - 126 U/L Final  . Total Bilirubin 12/11/2019 0.6  0.3 - 1.2 mg/dL Final  . GFR, Estimated 12/11/2019 53* >60 mL/min Final   Comment: (NOTE) Calculated using the CKD-EPI Creatinine Equation (2021)   . Anion gap 12/11/2019 9  5 - 15 Final   Performed at [facility], [address] [place], [place] [address]  . WBC 12/11/2019 8.0  4.0 - 10.5 K/uL Final  . RBC 12/11/2019 4.65  3.87 - 5.11 MIL/uL Final  . Hemoglobin 12/11/2019 13.9  12.0 - 15.0 g/dL Final  . HCT 12/11/2019 42.9  36.0 - 46.0 % Final  . MCV 12/11/2019 92.3  80.0 - 100.0 fL Final  . MCH 12/11/2019 29.9  26.0 - 34.0 pg Final  . MCHC 12/11/2019 32.4  30.0 - 36.0 g/dL Final  . RDW 12/11/2019 14.7  11.5 - 15.5 % Final  . Platelets 12/11/2019 267  150 - 400 K/uL Final  . nRBC  12/11/2019 0.0  0.0 - 0.2 % Final  . Neutrophils Relative % 12/11/2019 66  % Final  . Neutro Abs 12/11/2019 5.3  1.7 - 7.7 K/uL Final  . Lymphocytes Relative 12/11/2019 24  % Final  . Lymphs Abs 12/11/2019 1.9  0.7 - 4.0 K/uL Final  . Monocytes Relative 12/11/2019 8  % Final  . Monocytes Absolute 12/11/2019 0.7  0.1 - 1.0 K/uL Final  . Eosinophils Relative 12/11/2019 2  % Final  . Eosinophils Absolute 12/11/2019 0.2  0.0 - 0.5 K/uL Final  . Basophils Relative 12/11/2019 0  % Final  . Basophils Absolute 12/11/2019 0.0  0.0 - 0.1 K/uL Final  . Immature Granulocytes 12/11/2019 0  % Final  . Abs Immature Granulocytes 12/11/2019 0.02  0.00 - 0.07 K/uL Final   Performed at [facility], [address], [place], [place] [address]    Assessment:  Ethyl Vila is a 86 y.o. 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  Biopsy of the left nipple on 11/07/2019 revealed Paget's disease.    Left diagnostic mammogram on 12/29/2019 revealed an indeterminate 8 mm focal asymmetry with associated coarse heterogeneous calcifications in the central anterior left breast without a sonographic correlate. There was no evidence of left axillary lymphadenopathy.  She has a history of right breast cancer (no details available) s/p right mastectomy in 1980.    She has Alzheimer's dementia.  She has a suprapubic catheter for urinary retention.  Catheter was placed in 03/2019 in [place] secondary to difficulty with self catheterization (non-sterile technique and recurrent infections).  Symptomatically, ***  Plan: 1.   Paget's disease of left nipple             Discuss Paget's disease- breast cancer in the epidermis of the nipple areolar complex.             Patient's typically present with itching, bleeding, or eczema of the nipple.             80-90% of the time, there is an associated lesion (DCIS or invasive cancer) in the breast.             Obtain pathology from dermatology.              Bilateral diagnostic mammogram.  Consider breast MRI.             If lesion noted on mammogram, discuss plan for biopsy.             If Paget's alone, will require surgery (lumpectomy + radiation or mastectomy).             If Paget's associated with DCIS or invasive breast cancer will need additional treatment dictated by underlying breast cancer.   I discussed the assessment and treatment plan with the patient.  The patient was provided an opportunity to ask questions and all were answered.  The patient agreed with the plan and demonstrated an understanding of the instructions.  The patient was advised to call back if the symptoms worsen or if the condition fails to improve as anticipated.  I provided *** minutes of face-to-face time during this this encounter and > 50% was spent counseling as documented under my assessment and plan.    Melissa C. Mike Gip, MD, PhD    01/01/2020, 2:02 PM  I, Mirian Mo Tufford, am acting as Education administrator for [employer]. Mike Gip, MD, PhD.  {Add scribe attestation statement}

## 2020-01-01 NOTE — Progress Notes (Signed)
Suprapubic Cath Change Patient is present today for a suprapubic catheter change due to clogged catheter.  9 ml of water was drained from the balloon, a 18 FR foley cath was removed from the tract with out difficulty.  It was clogged with sediment.  Site was cleaned and prepped in a sterile fashion with betadine.  A 18 FR foley cath was replaced into the tract no complications were noted. Urine return was noted, 10 ml of sterile water was inflated into the balloon and a leg bag was attached for drainage.  Patient tolerated well.       Performed by: Zara Council, PA-C  Follow up: 3 weeks for catheter exchange.   Will start with the vinegar irrigations.

## 2020-01-01 NOTE — Patient Instructions (Signed)
Vinegar Bladder Irrigation Protocol patient education  Patient's on intermittent catheterization with chronic bacteriuria and/or chronic bladder stones, irrigating the bladder with a dilute vinegar solution can be beneficial.  The recommended concentration is 0.25% acetic acid. Most grocery stores carry white vinegar as a 5% solution.  Therefore, to make appropriate bladder irrigations, it needs to be diluted at a ratio of roughly 20:1.  To achieve this, see the chart below to determine what amount of 5% white vinegar solution you should mix with your normal bladder irrigation (homemade saline or sterile sodium chloride from the pharmacy).  Amount of 5% White Vinegar Solution to Mix In: If Your Normal Bladder Irrigation Amount Is: 2.5 teaspoons (12.5 mL) 250 mL irrigation 5 teaspoons (25 mL) 500 mL irrigation 10 teaspoons (50 mL) 1000 mL irrigation About 6 ounces 1 gallon irrigation  It might be helpful to remember:  One teaspoon = 5 mL  One tablespoon = 15 mL  Irrigate the bladder with this solution using the volumes and techniques you've discussed with your health care provider. In certain circumstances, your provider might instruct you to leave some irrigation in the bladder for a period of time to dissolve debris/mucous. Discontinue irrigation if it causes pain or discomfort. Do not use this solution if you believe your child has an acute urinary tract infection.  

## 2020-01-02 ENCOUNTER — Inpatient Hospital Stay: Payer: Medicare Other | Attending: Hematology and Oncology | Admitting: Hematology and Oncology

## 2020-01-11 ENCOUNTER — Ambulatory Visit: Payer: Self-pay | Admitting: *Deleted

## 2020-01-11 NOTE — Telephone Encounter (Signed)
Morgan Schaefer, pt's guardian called in.  Pt has Pagent's Disease.  She has a spot behind her nipple on the x ray on the left side.  She has failed over the last few days.  She fell on Christmas Day.  She is very fatigued.   Yesterday she had diarrhea all over the floor.  The diarrhea is not usual for her as she has problems with constipation and diarrhea but this is the first time she hasn't made it to the bathroom.  She wears Depends but this is the first time not making it to the bathroom.   I'm giving her plenty of fluids.  All she drinks is chocolate drinks and eating chocolate candy even during the night.  She's not eating much of anything else.  She is very fatigued so I just wanted to be sure something else is not going on with her.   Maybe the cancer is draining her or possibly some depression.  She has cancer in left nipple Pagent's Disease.  I think the cancer is draining her.   I just want her checked.   It can be next week even due to the holidays.  The covid questionnaire was completed and indicated a video visit however Morgan Schaefer would prefer to bring Morgan Schaefer in if possible due to the issues Morgan Schaefer is having.  I let her know I would send a note to Dr. Karn Cassis office requesting a in person visit and someone would give her a call back.  Morgan Schaefer was agreeable to this plan.  She can be reached at [phone].  I sent my notes to [facility] high priority.   Reason for Disposition . [1] Fatigue (i.e., tires easily, decreased energy) AND [2] persists > 1 week  Answer Assessment - Initial Assessment Questions 1. DESCRIPTION: "Describe how you are feeling."     See progress notes for full details Morgan Schaefer has noticed Morgan Schaefer has been more fatigued than usual lately.   Pt does has Pageant's Disease in her left nipple.    2. SEVERITY: "How bad is it?"  "Can you stand and walk?"   - MILD - Feels weak or tired, but does not interfere with work, school or normal  activities   - MODERATE - Able to stand and walk; weakness interferes with work, school, or normal activities   - SEVERE - Unable to stand or walk     She fell on Christmas Day getting into her night gown.  She had a sore knee and hip but is fine now.  This is not like her to be this way.   3. ONSET:  "When did the weakness begin?"     Progressly worse lately. 4. CAUSE: "What do you think is causing the weakness?"     Maybe the cancer or depression 5. MEDICINES: "Have you recently started a new medicine or had a change in the amount of a medicine?"     No 6. OTHER SYMPTOMS: "Do you have any other symptoms?" (e.g., chest pain, fever, cough, SOB, vomiting, diarrhea, bleeding, other areas of pain)     See progress notes. 7. PREGNANCY: "Is there any chance you are pregnant?" "When was your last menstrual period?"     N/A  Protocols used: WEAKNESS (GENERALIZED) AND FATIGUE-A-AH

## 2020-01-11 NOTE — Telephone Encounter (Signed)
Pt is scheduled for Tues. 1/4. Spoke with Morgan Schaefer told her if pt get worse to take her to [facility] or ER. Also to make sure the pt is hydrated to drink plenty of fluids. Pt verbalized understanding.  KP

## 2020-01-15 ENCOUNTER — Ambulatory Visit: Payer: Medicare Other | Admitting: Urology

## 2020-01-16 ENCOUNTER — Other Ambulatory Visit: Payer: Self-pay

## 2020-01-16 ENCOUNTER — Encounter: Payer: Self-pay | Admitting: Internal Medicine

## 2020-01-16 ENCOUNTER — Ambulatory Visit (INDEPENDENT_AMBULATORY_CARE_PROVIDER_SITE_OTHER): Payer: Medicare Other | Admitting: Internal Medicine

## 2020-01-16 VITALS — BP 112/78 | HR 85 | Ht 62.0 in | Wt 144.0 lb

## 2020-01-16 DIAGNOSIS — I498 Other specified cardiac arrhythmias: Secondary | ICD-10-CM | POA: Diagnosis not present

## 2020-01-16 DIAGNOSIS — I10 Essential (primary) hypertension: Secondary | ICD-10-CM | POA: Diagnosis not present

## 2020-01-16 DIAGNOSIS — E039 Hypothyroidism, unspecified: Secondary | ICD-10-CM | POA: Diagnosis not present

## 2020-01-16 DIAGNOSIS — K219 Gastro-esophageal reflux disease without esophagitis: Secondary | ICD-10-CM

## 2020-01-16 DIAGNOSIS — F0280 Dementia in other diseases classified elsewhere without behavioral disturbance: Secondary | ICD-10-CM

## 2020-01-16 DIAGNOSIS — I493 Ventricular premature depolarization: Secondary | ICD-10-CM | POA: Insufficient documentation

## 2020-01-16 DIAGNOSIS — G301 Alzheimer's disease with late onset: Secondary | ICD-10-CM

## 2020-01-16 DIAGNOSIS — C50012 Malignant neoplasm of nipple and areola, left female breast: Secondary | ICD-10-CM

## 2020-01-16 MED ORDER — NITROGLYCERIN 0.4 MG SL SUBL: 0.4000 mg | SUBLINGUAL_TABLET | SUBLINGUAL | 0 refills | Status: AC | PRN

## 2020-01-16 NOTE — Progress Notes (Signed)
Date:  01/16/2020   Name:  Morgan Schaefer   DOB:  1925-05-08   MRN:  [id number]   Chief Complaint: Fall (Does she need shingles shot? Never had one. Does she still need to continue statin? Patient complaining of being weak and tired which is not like her and fell once on Christmas night. Bruised her right knee and right calf. ), Diarrhea, Skin Cancer (Can this have anything to do with her fatigue and weakness? Not following up with dermatology. Skin looks good. Will be seeing Dr. Mike Gip this Thursday for follow up. Questions on Surgery opinions for patient with skilled nursing after surgery. ), and Memory Loss (Patient never been prescribed anything for memory and never had actual diagnosis. )  Fall The accident occurred 5 to 7 days ago. The fall occurred while standing (when putting on her pants). She fell from a height of 1 to 2 ft. She landed on carpet. The point of impact was the right knee. The patient is experiencing no pain. Pertinent negatives include no abdominal pain, fever or headaches.  Hypertension This is a chronic problem. The problem is controlled. Pertinent negatives include no chest pain, headaches, palpitations or shortness of breath. Past treatments include calcium channel blockers. The current treatment provides significant improvement. There are no compliance problems.    Cognitive deficit/dementia - no formal evaluation.  POA wondering about medication to help slow the decline.  She is ambulatory with rolator, eats fairly well, sleeps well.  Paget's disease of breast - planning for either removal of the nipple or a mastectomy with Dr. Bary Castilla.  Seeing Dr. Mike Gip this week.  She denies pain.  Risk for Shingles - she is a candidate for the vaccine but I would not recommend at this time.  The stress of surgery could cause a case but the vaccine itself has side effects and it takes up to 2 months to complete the series.    Lab Results  Component Value Date   CREATININE  1.00 12/11/2019   BUN 32 (H) 12/11/2019   NA 139 12/11/2019   K 4.5 12/11/2019   CL 105 12/11/2019   CO2 25 12/11/2019   Lab Results  Component Value Date   CHOL 199 08/01/2019   HDL 78 08/01/2019   LDLCALC 94 08/01/2019   TRIG 159 (H) 08/01/2019   CHOLHDL 2.6 08/01/2019   Lab Results  Component Value Date   TSH 3.830 08/01/2019   No results found for: HGBA1C Lab Results  Component Value Date   WBC 8.0 12/11/2019   HGB 13.9 12/11/2019   HCT 42.9 12/11/2019   MCV 92.3 12/11/2019   PLT 267 12/11/2019   Lab Results  Component Value Date   ALT 13 12/11/2019   AST 19 12/11/2019   ALKPHOS 72 12/11/2019   BILITOT 0.6 12/11/2019     Review of Systems  Constitutional: Positive for fatigue (takes more naps). Negative for chills, fever and unexpected weight change.  Respiratory: Negative for cough, chest tightness, shortness of breath and wheezing.   Cardiovascular: Negative for chest pain, palpitations and leg swelling.  Gastrointestinal: Positive for diarrhea (occasional fecal incontinence). Negative for abdominal pain.  Genitourinary:       Has suprapubic catheter  Musculoskeletal: Positive for arthralgias and gait problem.  Neurological: Negative for dizziness and headaches.  Psychiatric/Behavioral: Positive for confusion. Negative for agitation, dysphoric mood and sleep disturbance. The patient is not nervous/anxious.     Patient Active Problem List   Diagnosis Date Noted  .  Paget's carcinoma of the nipple, left ([place]) 12/11/2019  . Dementia ([place]) 08/01/2019  . Constipation 08/01/2019  . Benign essential HTN 07/31/2019  . Hyperlipidemia, mixed 07/31/2019  . Hypothyroidism (acquired) 07/31/2019  . Urinary retention 07/31/2019  . Suprapubic catheter ([place]) 07/31/2019  . Hyponatremia 07/31/2019  . Barrett's esophagus 07/31/2019  . Anemia 07/31/2019  . Memory loss 07/31/2019  . Chronic bronchitis ([place]) 07/31/2019  . GERD (gastroesophageal reflux disease) 07/31/2019     Allergies  Allergen Reactions  . Ciprofloxacin Other (See Comments)    Unkown Other reaction(s): Unknown  . Penicillins Other (See Comments)    unknown  . Sulfa Antibiotics Other (See Comments)    UNKNOWN    Past Surgical History:  Procedure Laterality Date  . INTERSTIM IMPLANT PLACEMENT  05/2010  . INTRAOCULAR LENS IMPLANT, SECONDARY  2002  . MASTECTOMY Right 1980    Social History   Tobacco Use  . Smoking status: Never Smoker  . Smokeless tobacco: Never Used  Vaping Use  . Vaping Use: Never used  Substance Use Topics  . Alcohol use: Not Currently  . Drug use: Not Currently     Medication list has been reviewed and updated.  Current Meds  Medication Sig  . amLODipine (NORVASC) 5 MG tablet Take 5 mg by mouth daily.  Marland Kitchen aspirin EC 81 MG tablet Take 81 mg by mouth daily. Swallow whole.  . fluticasone-salmeterol (ADVAIR HFA) 115-21 MCG/ACT inhaler Inhale 2 puffs into the lungs 2 (two) times daily as needed.  Marland Kitchen guaiFENesin (MUCINEX) 600 MG 12 hr tablet Take 1 tablet by mouth 2 (two) times daily.  Marland Kitchen levothyroxine (SYNTHROID) 25 MCG tablet Take 25 mcg by mouth daily before breakfast.  . loratadine (CLARITIN) 10 MG tablet Take 10 mg by mouth daily.  . montelukast (SINGULAIR) 10 MG tablet Take 10 mg by mouth at bedtime.  . Multiple Vitamins-Minerals (MULTIVITAMINS THER. W/MINERALS) TABS tablet Take 1 tablet by mouth daily.  . Multiple Vitamins-Minerals (OCUVITE ADULT 50+ PO) Take 1 capsule by mouth daily.  . nitroGLYCERIN (NITROSTAT) 0.4 MG SL tablet Place 0.4 mg under the tongue every 5 (five) minutes as needed for chest pain.  . pantoprazole (PROTONIX) 40 MG tablet Take 40 mg by mouth daily.  . polyethylene glycol (MIRALAX / GLYCOLAX) 17 g packet Take 17 g by mouth 3 (three) times a week.  . saccharomyces boulardii (FLORASTOR) 250 MG capsule Take 250 mg by mouth 2 (two) times daily. ( Probiotics OTC )  . simvastatin (ZOCOR) 20 MG tablet Take 20 mg by mouth daily.     PHQ 2/9 Scores 01/16/2020 01/16/2020 08/01/2019  PHQ - 2 Score 0 0 0  PHQ- 9 Score 9 0 0    GAD 7 : Generalized Anxiety Score 01/16/2020 01/16/2020 08/01/2019  Nervous, Anxious, on Edge 0 0 0  Control/stop worrying 0 0 0  Worry too much - different things 0 0 0  Trouble relaxing 0 0 0  Restless 0 0 0  Easily annoyed or irritable 0 0 0  Afraid - awful might happen 0 0 0  Total GAD 7 Score 0 0 0  Anxiety Difficulty Not difficult at all Not difficult at all Not difficult at all   6CIT Screen 01/16/2020  What Year? 4 points  What month? 0 points  What time? 3 points  Count back from 20 0 points  Months in reverse 4 points  Repeat phrase 6 points  Total Score 17  (17/28 - 0 is normal.)   BP  Readings from Last 3 Encounters:  01/16/20 112/78  01/01/20 (!) 146/87  12/19/19 140/85    Physical Exam Vitals and nursing note reviewed.  Constitutional:      General: She is not in acute distress.    Appearance: Normal appearance. She is well-developed.  HENT:     Head: Normocephalic and atraumatic.  Cardiovascular:     Rate and Rhythm: Normal rate. Frequent extrasystoles are present.    Heart sounds: Heart sounds are distant.  Pulmonary:     Effort: Pulmonary effort is normal. No respiratory distress.  Musculoskeletal:        General: Normal range of motion.     Cervical back: Normal range of motion.     Right lower leg: No edema.     Left lower leg: No edema.  Skin:    General: Skin is warm and dry.     Findings: No rash.  Neurological:     Mental Status: She is alert and oriented to person, place, and time.  Psychiatric:        Mood and Affect: Mood and affect normal.        Behavior: Behavior normal.        Thought Content: Thought content normal.     Wt Readings from Last 3 Encounters:  01/16/20 144 lb (65.3 kg)  01/01/20 147 lb (66.7 kg)  12/19/19 146 lb (66.2 kg)    BP 112/78   Pulse 85   Ht 5\' 2"  (1.575 m)   Wt 144 lb (65.3 kg)   SpO2 95%   BMI 26.34 kg/m    Assessment and Plan: 1. Other cardiac arrhythmia - nitroGLYCERIN (NITROSTAT) 0.4 MG SL tablet; Place 1 tablet (0.4 mg total) under the tongue every 5 (five) minutes as needed for chest pain.  Dispense: 25 tablet; Refill: 0 - EKG 12-Lead - SR @ 86 with frequent VEB  2. Benign essential HTN Clinically stable exam with well controlled BP on amlodipine. Tolerating medications without side effects at this time. Pt to continue current regimen and low sodium diet; benefits of regular exercise as able discussed.  3. Gastroesophageal reflux disease, unspecified whether esophagitis present stable  4. Hypothyroidism (acquired) supplemented  5. Late onset Alzheimer's dementia without behavioral disturbance (HCC) More likely senile dementia rather than Alzheimers Given the moderate stage I would not recommend medications  6. Paget's carcinoma of the nipple, left (HCC) Under the care of Gen Surg and Oncology. I do not recommend getting Shringrix vaccines at this time - can consider after recovered from breast surgery   Partially dictated using Dragon software. Any errors are unintentional.  , MD [facility]  01/16/2020

## 2020-01-17 NOTE — Progress Notes (Signed)
[facility]  [address] [place], [place] [address] Phone: [phone]  Fax: [phone]   Clinic Day:  01/18/2020  Referring physician: Reubin Milan, MD  Chief Complaint: Morgan Schaefer is a 87 y.o. female with Paget's disease of the left nipple who is seen for review of work-up and discussion regarding direction of therapy.  HPI: The patient was last seen in the medical oncology clinic on 12/11/2019 for new patient assessment. At that time, she described a 6 week history of changes in her left nipple. Biopsy by Dr Jesusita Oka in dermatology confirmed Paget's disease of the left nipple areolar complex.  We discussed imaging and referral to surgery for excision (lumpectomy vs mastectomy).  Hematocrit was 42.9, hemoglobin 13.9, platelets 267,000, WBC 8,000. Creatinine was 1.00 (CrCl 53 ml/min). CA27.29 was 22.2.   The patient was seen in consultation by Dr. Lemar Livings on 12/21/2019. Surgical options of breast conservation vs mastectomy were discussed.  The pros and cons of mammography were discussed.  The patient was going to consider her options and likely proceed with mammography.  She was felt not to require radiation if excision performed given her age and mental status.  Left diagnostic mammogram on 12/29/2019 revealed an indeterminate 8 mm focal asymmetry with associated coarse heterogeneous calcifications in the central anterior left breast without a sonographic correlate. There was no evidence of left axillary lymphadenopathy.  There was no evidence of left axillary lymphadenopathy.    Prior mammograms from 11/02/2013 an 11/01/2012 revealed the asymmetry in the central anterior left breast had been stable since 2014.  The scant calcifications associated with the asymmetry had developed over time (one calcification was seen on the 2014 exam) but are coarse and heterogeneous as expected with a benign involuting fibroadenoma. Overall, this finding  has a benign appearance, and therefore biopsy was not necessary.   Symptomatically, she has been "ok". She had a fall recently and has been fatigued. Her guardian states that her nipple appears to be back to normal.  The patient and her POA have discussed the situation but the patient cannot hear well and often forgets the conversations. She did say once that if she has cancer like she did on the right side, she would like to have it cut off.  She eats well and drinks Ensure.    Past Medical History:  Diagnosis Date  . Hyperlipidemia   . Paget disease of breast, left (HCC)   . Thyroid disease     Past Surgical History:  Procedure Laterality Date  . INTERSTIM IMPLANT PLACEMENT  05/2010  . INTRAOCULAR LENS IMPLANT, SECONDARY  2002  . MASTECTOMY Right 1980    Family History  Problem Relation Age of Onset  . Heart disease Mother   . Heart disease Father     Social History:  reports that she has never smoked. She has never used smokeless tobacco. She reports previous alcohol use. She reports previous drug use.  She used to drink but does not anymore. She denies a history of tobacco use. She denies exposure to radiation or toxins. 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 She grew up in [place], then moved to [place] and stayed in assisted living, then moved to [place] to live with [place]. The patient lives with her guardian and POA, Lynnae Sandhoff. She is accompanied by Lynnae Sandhoff today.  Allergies:  Allergies  Allergen Reactions  . Ciprofloxacin Other (See Comments)    Unkown Other reaction(s): Unknown  .  Penicillins Other (See Comments)    unknown  . Sulfa Antibiotics Other (See Comments)    UNKNOWN    Current Medications: Current Outpatient Medications  Medication Sig Dispense Refill  . amLODipine (NORVASC) 5 MG tablet Take 5 mg by mouth daily.    Marland Kitchen aspirin EC 81 MG tablet Take 81 mg by mouth daily. Swallow whole.    . fluticasone-salmeterol (ADVAIR HFA) 115-21  MCG/ACT inhaler Inhale 2 puffs into the lungs 2 (two) times daily as needed.    Marland Kitchen guaiFENesin (MUCINEX) 600 MG 12 hr tablet Take 1 tablet by mouth 2 (two) times daily.    Marland Kitchen levothyroxine (SYNTHROID) 25 MCG tablet Take 25 mcg by mouth daily before breakfast.    . loratadine (CLARITIN) 10 MG tablet Take 10 mg by mouth daily.    . montelukast (SINGULAIR) 10 MG tablet Take 10 mg by mouth at bedtime.    . Multiple Vitamins-Minerals (MULTIVITAMINS THER. W/MINERALS) TABS tablet Take 1 tablet by mouth daily.    . Multiple Vitamins-Minerals (OCUVITE ADULT 50+ PO) Take 1 capsule by mouth daily.    . nitroGLYCERIN (NITROSTAT) 0.4 MG SL tablet Place 1 tablet (0.4 mg total) under the tongue every 5 (five) minutes as needed for chest pain. 25 tablet 0  . pantoprazole (PROTONIX) 40 MG tablet Take 40 mg by mouth daily.    . polyethylene glycol (MIRALAX / GLYCOLAX) 17 g packet Take 17 g by mouth 3 (three) times a week.    . saccharomyces boulardii (FLORASTOR) 250 MG capsule Take 250 mg by mouth 2 (two) times daily. ( Probiotics OTC )    . simvastatin (ZOCOR) 20 MG tablet Take 20 mg by mouth daily.     No current facility-administered medications for this visit.    Review of Systems  Constitutional: Positive for malaise/fatigue and weight loss (2 lbs). Negative for chills, diaphoresis and fever.       Feels "ok".  HENT: Positive for hearing loss. Negative for congestion, ear discharge, ear pain, nosebleeds, sinus pain, sore throat and tinnitus.        Chronic runny nose  Eyes: Negative for blurred vision.  Respiratory: Negative for cough, hemoptysis, sputum production and shortness of breath.   Cardiovascular: Negative for chest pain, palpitations and leg swelling.  Gastrointestinal: Negative for abdominal pain, blood in stool, constipation, diarrhea, heartburn, melena, nausea and vomiting.  Genitourinary: Negative for dysuria, frequency, hematuria and urgency.       Suprapubic catheter  Musculoskeletal:  Positive for falls. Negative for back pain, joint pain, myalgias and neck pain.  Skin: Negative for itching and rash.  Neurological: Negative for dizziness, tingling, sensory change and weakness.       Poor balance  Endo/Heme/Allergies: Positive for environmental allergies. Does not bruise/bleed easily.  Psychiatric/Behavioral: Positive for memory loss (Alzheimer's). Negative for depression. The patient is not nervous/anxious and does not have insomnia.   All other systems reviewed and are negative.  Performance status (ECOG): 2  Vitals Blood pressure (!) 131/51, pulse 93, temperature (!) 96.8 F (36 C), temperature source Tympanic, resp. rate 18, weight 144 lb 13.5 oz (65.7 kg), SpO2 98 %.   Physical Exam Vitals and nursing note reviewed.  Constitutional:      General: She is not in acute distress.    Appearance: She is not diaphoretic.     Comments: Patient nodding off during visit.  Eyes:     General: No scleral icterus.    Conjunctiva/sclera: Conjunctivae normal.  Chest:  Breasts:  Right: Absent.      Comments: Left nipple well healed Psychiatric:        Judgment: Judgment normal.    09/27/2019: Left nipple (photo provided by patient's POA)    10/2019: Left nipple s/p biopsy (photo provided by patient's POA)    01/18/2020: Left nipple    No visits with results within 3 Day(s) from this visit.  Latest known visit with results is:  Appointment on 12/11/2019  Component Date Value Ref Range Status  . CA 27.29 12/11/2019 22.2  0.0 - 38.6 U/mL Final   Comment: (NOTE) Siemens Centaur Immunochemiluminometric Methodology [facility]) Values obtained with different assay methods or kits cannot be used interchangeably. Results cannot be interpreted as absolute evidence of the presence or absence of malignant disease. Performed At: [facility] [place], [place] [id number] Rush Farmer MD [id number]   . Sodium 12/11/2019 139  135 - 145 mmol/L  Final  . Potassium 12/11/2019 4.5  3.5 - 5.1 mmol/L Final  . Chloride 12/11/2019 105  98 - 111 mmol/L Final  . CO2 12/11/2019 25  22 - 32 mmol/L Final  . Glucose, Bld 12/11/2019 92  70 - 99 mg/dL Final   Glucose reference range applies only to samples taken after fasting for at least 8 hours.  . BUN 12/11/2019 32* 8 - 23 mg/dL Final  . Creatinine, Ser 12/11/2019 1.00  0.44 - 1.00 mg/dL Final  . Calcium 12/11/2019 9.6  8.9 - 10.3 mg/dL Final  . Total Protein 12/11/2019 7.3  6.5 - 8.1 g/dL Final  . Albumin 12/11/2019 4.1  3.5 - 5.0 g/dL Final  . AST 12/11/2019 19  15 - 41 U/L Final  . ALT 12/11/2019 13  0 - 44 U/L Final  . Alkaline Phosphatase 12/11/2019 72  38 - 126 U/L Final  . Total Bilirubin 12/11/2019 0.6  0.3 - 1.2 mg/dL Final  . GFR, Estimated 12/11/2019 53* >60 mL/min Final   Comment: (NOTE) Calculated using the CKD-EPI Creatinine Equation (2021)   . Anion gap 12/11/2019 9  5 - 15 Final   Performed at [facility], [address], [place], [place] [address]  . WBC 12/11/2019 8.0  4.0 - 10.5 K/uL Final  . RBC 12/11/2019 4.65  3.87 - 5.11 MIL/uL Final  . Hemoglobin 12/11/2019 13.9  12.0 - 15.0 g/dL Final  . HCT 12/11/2019 42.9  36.0 - 46.0 % Final  . MCV 12/11/2019 92.3  80.0 - 100.0 fL Final  . MCH 12/11/2019 29.9  26.0 - 34.0 pg Final  . MCHC 12/11/2019 32.4  30.0 - 36.0 g/dL Final  . RDW 12/11/2019 14.7  11.5 - 15.5 % Final  . Platelets 12/11/2019 267  150 - 400 K/uL Final  . nRBC 12/11/2019 0.0  0.0 - 0.2 % Final  . Neutrophils Relative % 12/11/2019 66  % Final  . Neutro Abs 12/11/2019 5.3  1.7 - 7.7 K/uL Final  . Lymphocytes Relative 12/11/2019 24  % Final  . Lymphs Abs 12/11/2019 1.9  0.7 - 4.0 K/uL Final  . Monocytes Relative 12/11/2019 8  % Final  . Monocytes Absolute 12/11/2019 0.7  0.1 - 1.0 K/uL Final  . Eosinophils Relative 12/11/2019 2  % Final  . Eosinophils Absolute 12/11/2019 0.2  0.0 - 0.5 K/uL Final  . Basophils Relative 12/11/2019 0  % Final   . Basophils Absolute 12/11/2019 0.0  0.0 - 0.1 K/uL Final  . Immature Granulocytes 12/11/2019 0  % Final  . Abs  Immature Granulocytes 12/11/2019 0.02  0.00 - 0.07 K/uL Final   Performed at [facility], [address] [place], [place] [address]    Assessment:  Morgan Schaefer is a 87 y.o. 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  Biopsy of the left nipple on 11/07/2019 revealed Paget's disease.    Left diagnostic mammogram on 12/29/2019 revealed an indeterminate 8 mm focal asymmetry with associated coarse heterogeneous calcifications in the central anterior left breast without a sonographic correlate. There was no evidence of left axillary lymphadenopathy.    Prior mammograms from 11/02/2013 an 11/01/2012 revealed the asymmetry in the central anterior left breast has been stable since 2014.  The scant calcifications associated with the asymmetry had developed over time (one calcification was seen on the 2014 exam) but are coarse and heterogeneous as expected with a benign involuting fibroadenoma. Overall, this finding has a benign appearance  She has a history of right breast cancer (no details available) s/p right mastectomy in 1980.    She has Alzheimer's dementia.  She has a suprapubic catheter for urinary retention.  Catheter was placed in 03/2019 in [place] secondary to difficulty with self catheterization (non-sterile technique and recurrent infections).  Symptomatically, she denies any concerns.  Her left nipple has improved dramtically.  Plan: 1.   Paget's disease of left nipple             Review interval mammogram- 8 mm asymmetry appears stable since 2014.   No lesion felt necessary to biopsy.   Discuss the potential for DCIS in the left breast.   Discuss avoiding breast MRI given her age.  Discuss surgery (lumpectomy + radiation or mastectomy).   Contact Dr Baruch Gouty regarding radiation if lumpectomy pursued             Guardian weighing  options (observation vs surgery).  Present at tumor board on 01/22/2020. 2.   Tumor board on 01/22/2020. 3.   MD to call patient/guardian after tumor board. 4.   RTC in 1 month for MD assessment.  Addendum:  Dr Baruch Gouty would recommend radiation if lumpectomy pursued.  I discussed the assessment and treatment plan with the patient.  The patient was provided an opportunity to ask questions and all were answered.  The patient agreed with the plan and demonstrated an understanding of the instructions.  The patient was advised to call back if the symptoms worsen or if the condition fails to improve as anticipated.  I provided 23 minutes of face-to-face time during this this encounter and > 50% was spent counseling as documented under my assessment and plan. An additional 10+ minutes were spent reviewing her chart (Epic and Care Everywhere) including notes, labs, and imaging studies and discussing with Dr Baruch Gouty.   Lenna Sciara C. Mike Gip, MD, PhD    01/18/2020, 11:33 AM  I, Mirian Mo Tufford, am acting as Education administrator for [employer]. Mike Gip, MD, PhD.  I, Cleopha Indelicato C. Mike Gip, MD, have reviewed the above documentation for accuracy and completeness, and I agree with the above.

## 2020-01-18 ENCOUNTER — Inpatient Hospital Stay: Payer: Medicare Other | Attending: Hematology and Oncology | Admitting: Hematology and Oncology

## 2020-01-18 ENCOUNTER — Other Ambulatory Visit: Payer: Self-pay

## 2020-01-18 ENCOUNTER — Encounter: Payer: Self-pay | Admitting: Hematology and Oncology

## 2020-01-18 VITALS — BP 131/51 | HR 93 | Temp 96.8°F | Resp 18 | Wt 144.8 lb

## 2020-01-18 DIAGNOSIS — Z7982 Long term (current) use of aspirin: Secondary | ICD-10-CM | POA: Insufficient documentation

## 2020-01-18 DIAGNOSIS — E079 Disorder of thyroid, unspecified: Secondary | ICD-10-CM | POA: Diagnosis not present

## 2020-01-18 DIAGNOSIS — Z7951 Long term (current) use of inhaled steroids: Secondary | ICD-10-CM | POA: Diagnosis not present

## 2020-01-18 DIAGNOSIS — Z79899 Other long term (current) drug therapy: Secondary | ICD-10-CM | POA: Insufficient documentation

## 2020-01-18 DIAGNOSIS — E785 Hyperlipidemia, unspecified: Secondary | ICD-10-CM | POA: Diagnosis not present

## 2020-01-18 DIAGNOSIS — R5383 Other fatigue: Secondary | ICD-10-CM | POA: Diagnosis not present

## 2020-01-18 DIAGNOSIS — C50012 Malignant neoplasm of nipple and areola, left female breast: Secondary | ICD-10-CM

## 2020-01-18 DIAGNOSIS — F0280 Dementia in other diseases classified elsewhere without behavioral disturbance: Secondary | ICD-10-CM | POA: Diagnosis not present

## 2020-01-18 DIAGNOSIS — R5381 Other malaise: Secondary | ICD-10-CM | POA: Insufficient documentation

## 2020-01-18 DIAGNOSIS — G309 Alzheimer's disease, unspecified: Secondary | ICD-10-CM | POA: Diagnosis not present

## 2020-01-21 NOTE — Progress Notes (Unsigned)
Suprapubic Cath Change  Patient is present today for a suprapubic catheter change due to urinary retention.  8 ml of water was drained from the balloon, an 18 FR foley cath was removed from the tract with out difficulty.  Site was cleaned and prepped in a sterile fashion with betadine.  A 18 FR foley cath was replaced into the tract no complications were noted. Urine return was noted, 10 ml of sterile water was inflated into the balloon and a leg bag was attached for drainage.  Patient tolerated well.   Performed by: Zara Council, PA-C  Follow up: 3 to 4 weeks for next SPT exchange

## 2020-01-22 ENCOUNTER — Ambulatory Visit (INDEPENDENT_AMBULATORY_CARE_PROVIDER_SITE_OTHER): Payer: Medicare Other | Admitting: Urology

## 2020-01-22 ENCOUNTER — Other Ambulatory Visit: Payer: Self-pay

## 2020-01-22 ENCOUNTER — Encounter: Payer: Self-pay | Admitting: Urology

## 2020-01-22 VITALS — BP 138/58 | HR 43 | Ht 62.0 in | Wt 145.0 lb

## 2020-01-22 DIAGNOSIS — Z9359 Other cystostomy status: Secondary | ICD-10-CM | POA: Diagnosis not present

## 2020-01-23 ENCOUNTER — Telehealth: Payer: Self-pay | Admitting: Hematology and Oncology

## 2020-01-23 NOTE — Telephone Encounter (Signed)
Re:  Tumor board discussions  Called to discuss tumor board recommendations.  Message left on voicemail to call clinic.   Lequita Asal, MD

## 2020-01-29 ENCOUNTER — Other Ambulatory Visit: Payer: Self-pay | Admitting: Internal Medicine

## 2020-01-29 NOTE — Telephone Encounter (Signed)
Medication: pantoprazole (PROTONIX) 40 MG tablet,   Has the patient contacted their pharmacy? {yes [id number] (Agent: If no, request that the patient contact the pharmacy for the refill.) (Agent: If yes, when and what did the pharmacy advise?)  Preferred Pharmacy (with phone number or street name): [facility] [phone]  Agent: Please be advised that RX refills may take up to 3 business days. We ask that you follow-up with your pharmacy.

## 2020-01-29 NOTE — Telephone Encounter (Signed)
Requested medication (s) are due for refill today: yes  Requested medication (s) are on the active medication list: yes  Last refill:  [facility]  Future visit scheduled:no  Notes to clinic:  [facility] Historic provider    Requested Prescriptions  Pending Prescriptions Disp Refills   pantoprazole (PROTONIX) 40 MG tablet      Sig: Take 1 tablet (40 mg total) by mouth daily.      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

## 2020-01-30 ENCOUNTER — Other Ambulatory Visit: Payer: Self-pay

## 2020-01-30 NOTE — Telephone Encounter (Signed)
Spoke to Morgan Schaefer told her that Dr. Army Melia was out of the office for a few days. Pt has enough meds for 30 days. Pt wants 90 days sent in thru mail order upon Dr. Oneal Deputy return.  KP

## 2020-02-05 ENCOUNTER — Telehealth: Payer: Self-pay | Admitting: Hematology and Oncology

## 2020-02-05 ENCOUNTER — Other Ambulatory Visit: Payer: Self-pay

## 2020-02-05 MED ORDER — PANTOPRAZOLE SODIUM 40 MG PO TBEC: 40.0000 mg | DELAYED_RELEASE_TABLET | Freq: Every day | ORAL | 1 refills | Status: DC

## 2020-02-05 NOTE — Telephone Encounter (Signed)
Guardian called to verify appt. The appointment is scheduled for 02/15/20.

## 2020-02-11 NOTE — Progress Notes (Incomplete)
Suprapubic Cath Change  Patient is present today for a suprapubic catheter change due to urinary retention.  ***ml of water was drained from the balloon, a ***FR foley cath was removed from the tract with out difficulty.  Site was cleaned and prepped in a sterile fashion with betadine.  A ***FR foley cath was replaced into the tract {dnt complications:[id number]}. Urine return was noted, 10 ml of sterile water was inflated into the balloon and a *** bag was attached for drainage.  Patient tolerated well. A night bag was given to patient and proper instruction was given on how to switch bags.    Preformed by: ***  Follow up: ***  

## 2020-02-11 NOTE — Progress Notes (Signed)
Suprapubic Cath Change  Patient is present today for a suprapubic catheter change due to urinary retention.  8 ml of water was drained from the balloon, a 18 FR foley cath was removed from the tract with out difficulty.  Site was cleaned and prepped in a sterile fashion with betadine.  A 18 FR foley cath was replaced into the tract no complications were noted. Urine return was noted, 10 ml of sterile water was inflated into the balloon and a leg bag was attached for drainage.  Patient tolerated well.   Performed by: Zara Council, PA-C  Follow up: One month for SPT exchange

## 2020-02-12 ENCOUNTER — Other Ambulatory Visit: Payer: Self-pay

## 2020-02-12 ENCOUNTER — Telehealth: Payer: Self-pay

## 2020-02-12 ENCOUNTER — Encounter: Payer: Self-pay | Admitting: Urology

## 2020-02-12 ENCOUNTER — Ambulatory Visit (INDEPENDENT_AMBULATORY_CARE_PROVIDER_SITE_OTHER): Payer: Medicare Other | Admitting: Urology

## 2020-02-12 VITALS — BP 136/69 | HR 32 | Ht 62.0 in | Wt 143.0 lb

## 2020-02-12 DIAGNOSIS — Z9359 Other cystostomy status: Secondary | ICD-10-CM

## 2020-02-12 MED ORDER — LEVOTHYROXINE SODIUM 25 MCG PO TABS: 25.0000 ug | ORAL_TABLET | Freq: Every day | ORAL | 1 refills | Status: DC

## 2020-02-12 NOTE — Telephone Encounter (Signed)
Sent to [facility].

## 2020-02-12 NOTE — Telephone Encounter (Signed)
Per POA, pt needs refill [facility]  levothyroxine (SYNTHROID) 25 MCG tablet [[id number]]

## 2020-02-14 NOTE — Progress Notes (Signed)
[facility]  [address] [place], [place] [address] Phone: [phone]  Fax: [phone]   Clinic Day:  02/15/2020  Referring physician: Glean Hess, MD  Chief Complaint: Morgan Schaefer is a 87 y.o. female with Paget's disease of the left nipple who is seen for 1 month assessment.  HPI: The patient was last seen in the medical oncology clinic on 01/18/2020 for new patient assessment. At that time, she denied any concerns. Her left nipple had improved dramtically.  She was presented at [facility] on 01/22/2020.  Dr Baruch Gouty recommended radiation if lumpectomy pursued.  She saw Zara Council on 02/12/2020 for suprapubic catheter exchange.  During the interim, she has been "okay." She does not know why she is here today. She denies any problems with her nipple. Her weight fluctuates between 144-146 lbs. She has been eating less over the winter. She denies chest pain, nausea, vomiting diarrhea. She is fatigued and has shortness of breath.  The patient rests for most of the day. She has help with showering. She can get dressed and use the restroom on her own. A caregiver comes in once per week. She uses a walker at all times because her balance is poor.   Past Medical History:  Diagnosis Date  . Hyperlipidemia   . Paget disease of breast, left (Accident)   . Thyroid disease     Past Surgical History:  Procedure Laterality Date  . INTERSTIM IMPLANT PLACEMENT  05/2010  . INTRAOCULAR LENS IMPLANT, SECONDARY  2002  . MASTECTOMY Right 1980    Family History  Problem Relation Age of Onset  . Heart disease Mother   . Heart disease Father     Social History:  reports that she has never smoked. She has never used smokeless tobacco. She reports previous alcohol use. She reports previous drug use.  She used to drink but does not anymore. She denies a history of tobacco use. She denies exposure to radiation or toxins. 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 She grew up in [place], then moved to [place] and stayed in assisted living, then moved to [place] to live with [place]. The patient lives with her guardian and POA, [name]. She is accompanied by [name] today.  Allergies:  Allergies  Allergen Reactions  . Ciprofloxacin Other (See Comments)    Unkown Other reaction(s): Unknown  . Penicillins Other (See Comments)    unknown  . Sulfa Antibiotics Other (See Comments)    UNKNOWN    Current Medications: Current Outpatient Medications  Medication Sig Dispense Refill  . amLODipine (NORVASC) 5 MG tablet Take 5 mg by mouth daily.    Marland Kitchen aspirin EC 81 MG tablet Take 81 mg by mouth daily. Swallow whole.    . fluticasone-salmeterol (ADVAIR HFA) 115-21 MCG/ACT inhaler Inhale 2 puffs into the lungs 2 (two) times daily as needed.    Marland Kitchen guaiFENesin (MUCINEX) 600 MG 12 hr tablet Take 1 tablet by mouth 2 (two) times daily.    Marland Kitchen levothyroxine (SYNTHROID) 25 MCG tablet Take 1 tablet (25 mcg total) by mouth daily before breakfast. 90 tablet 1  . loratadine (CLARITIN) 10 MG tablet Take 10 mg by mouth daily.    . montelukast (SINGULAIR) 10 MG tablet Take 10 mg by mouth at bedtime.    . Multiple Vitamins-Minerals (MULTIVITAMINS THER. W/MINERALS) TABS tablet Take 1 tablet by mouth daily.    . Multiple Vitamins-Minerals (OCUVITE ADULT 50+ PO) Take 1 capsule by  mouth daily.    . nitroGLYCERIN (NITROSTAT) 0.4 MG SL tablet Place 1 tablet (0.4 mg total) under the tongue every 5 (five) minutes as needed for chest pain. 25 tablet 0  . pantoprazole (PROTONIX) 40 MG tablet Take 1 tablet (40 mg total) by mouth daily. 90 tablet 1  . polyethylene glycol (MIRALAX / GLYCOLAX) 17 g packet Take 17 g by mouth 3 (three) times a week.    . saccharomyces boulardii (FLORASTOR) 250 MG capsule Take 250 mg by mouth 2 (two) times daily. ( Probiotics OTC )    . simvastatin (ZOCOR) 20 MG tablet Take 20 mg by mouth daily.     No current facility-administered  medications for this visit.    Review of Systems  Constitutional: Positive for malaise/fatigue and weight loss (stable). Negative for chills, diaphoresis and fever.       Feels "ok".  HENT: Positive for hearing loss. Negative for congestion, ear discharge, ear pain, nosebleeds, sinus pain, sore throat and tinnitus.   Eyes: Negative for blurred vision.  Respiratory: Positive for shortness of breath. Negative for cough, hemoptysis and sputum production.   Cardiovascular: Negative for chest pain, palpitations and leg swelling.  Gastrointestinal: Negative for abdominal pain, blood in stool, constipation, diarrhea, heartburn, melena, nausea and vomiting.       Eating less over the winter.  Genitourinary: Negative for dysuria, frequency, hematuria and urgency.       Suprapubic catheter  Musculoskeletal: Negative for back pain, joint pain, myalgias and neck pain.  Skin: Negative for itching and rash.  Neurological: Negative for dizziness, tingling, sensory change and weakness.       Poor balance, uses a walker  Endo/Heme/Allergies: Does not bruise/bleed easily.  Psychiatric/Behavioral: Positive for memory loss (Alzheimer's). Negative for depression. The patient is not nervous/anxious and does not have insomnia.   All other systems reviewed and are negative.  Performance status (ECOG): 2-3  Vitals Blood pressure (!) 145/66, pulse 92, temperature 98.1 F (36.7 C), temperature source Tympanic, resp. rate 16, weight 144 lb (65.3 kg), SpO2 98 %.   Physical Exam Vitals and nursing note reviewed.  Constitutional:      General: She is not in acute distress.    Appearance: She is not diaphoretic.     Comments: Patient examined in wheelchair.  HENT:     Head: Normocephalic and atraumatic.     Mouth/Throat:     Mouth: Mucous membranes are moist.     Pharynx: Oropharynx is clear.  Eyes:     General: No scleral icterus.    Extraocular Movements: Extraocular movements intact.      Conjunctiva/sclera: Conjunctivae normal.     Pupils: Pupils are equal, round, and reactive to light.  Cardiovascular:     Rate and Rhythm: Normal rate and regular rhythm.     Heart sounds: Normal heart sounds. No murmur heard.   Pulmonary:     Effort: Pulmonary effort is normal. No respiratory distress.     Breath sounds: Normal breath sounds. No wheezing or rales.  Chest:     Chest wall: No tenderness.  Breasts:     Right: Absent. No axillary adenopathy or supraclavicular adenopathy.     Left: Skin change (Left nipple similar to last visit, no longer scaly) and tenderness present. No swelling, bleeding, mass, axillary adenopathy or supraclavicular adenopathy.    Abdominal:     General: Bowel sounds are normal. There is no distension.     Palpations: Abdomen is soft. There is no mass.  Tenderness: There is no abdominal tenderness. There is no guarding or rebound.  Musculoskeletal:        General: No swelling or tenderness. Normal range of motion.     Cervical back: Normal range of motion and neck supple.  Lymphadenopathy:     Head:     Right side of head: No preauricular, posterior auricular or occipital adenopathy.     Left side of head: No preauricular, posterior auricular or occipital adenopathy.     Cervical: No cervical adenopathy.     Upper Body:     Right upper body: No supraclavicular or axillary adenopathy.     Left upper body: No supraclavicular or axillary adenopathy.     Lower Body: No right inguinal adenopathy. No left inguinal adenopathy.  Skin:    General: Skin is warm and dry.  Neurological:     Mental Status: She is alert and oriented to person, place, and time.  Psychiatric:        Behavior: Behavior normal.        Thought Content: Thought content normal.        Judgment: Judgment normal.    09/27/2019: Left nipple (photo provided by patient's POA)    10/2019: Left nipple s/p biopsy (photo provided by patient's POA)    01/18/2020: Left  nipple    No visits with results within 3 Day(s) from this visit.  Latest known visit with results is:  Appointment on 12/11/2019  Component Date Value Ref Range Status  . CA 27.29 12/11/2019 22.2  0.0 - 38.6 U/mL Final   Comment: (NOTE) Siemens Centaur Immunochemiluminometric Methodology [facility]) Values obtained with different assay methods or kits cannot be used interchangeably. Results cannot be interpreted as absolute evidence of the presence or absence of malignant disease. Performed At: [facility] [place], [place] [id number] Rush Farmer MD [id number]   . Sodium 12/11/2019 139  135 - 145 mmol/L Final  . Potassium 12/11/2019 4.5  3.5 - 5.1 mmol/L Final  . Chloride 12/11/2019 105  98 - 111 mmol/L Final  . CO2 12/11/2019 25  22 - 32 mmol/L Final  . Glucose, Bld 12/11/2019 92  70 - 99 mg/dL Final   Glucose reference range applies only to samples taken after fasting for at least 8 hours.  . BUN 12/11/2019 32* 8 - 23 mg/dL Final  . Creatinine, Ser 12/11/2019 1.00  0.44 - 1.00 mg/dL Final  . Calcium 12/11/2019 9.6  8.9 - 10.3 mg/dL Final  . Total Protein 12/11/2019 7.3  6.5 - 8.1 g/dL Final  . Albumin 12/11/2019 4.1  3.5 - 5.0 g/dL Final  . AST 12/11/2019 19  15 - 41 U/L Final  . ALT 12/11/2019 13  0 - 44 U/L Final  . Alkaline Phosphatase 12/11/2019 72  38 - 126 U/L Final  . Total Bilirubin 12/11/2019 0.6  0.3 - 1.2 mg/dL Final  . GFR, Estimated 12/11/2019 53* >60 mL/min Final   Comment: (NOTE) Calculated using the CKD-EPI Creatinine Equation (2021)   . Anion gap 12/11/2019 9  5 - 15 Final   Performed at [facility], [address] [place], [place] [address]  . WBC 12/11/2019 8.0  4.0 - 10.5 K/uL Final  . RBC 12/11/2019 4.65  3.87 - 5.11 MIL/uL Final  . Hemoglobin 12/11/2019 13.9  12.0 - 15.0 g/dL Final  . HCT 12/11/2019 42.9  36.0 - 46.0 % Final  . MCV 12/11/2019 92.3  80.0 - 100.0 fL Final  .  MCH 12/11/2019 29.9  26.0 - 34.0 pg  Final  . MCHC 12/11/2019 32.4  30.0 - 36.0 g/dL Final  . RDW 12/11/2019 14.7  11.5 - 15.5 % Final  . Platelets 12/11/2019 267  150 - 400 K/uL Final  . nRBC 12/11/2019 0.0  0.0 - 0.2 % Final  . Neutrophils Relative % 12/11/2019 66  % Final  . Neutro Abs 12/11/2019 5.3  1.7 - 7.7 K/uL Final  . Lymphocytes Relative 12/11/2019 24  % Final  . Lymphs Abs 12/11/2019 1.9  0.7 - 4.0 K/uL Final  . Monocytes Relative 12/11/2019 8  % Final  . Monocytes Absolute 12/11/2019 0.7  0.1 - 1.0 K/uL Final  . Eosinophils Relative 12/11/2019 2  % Final  . Eosinophils Absolute 12/11/2019 0.2  0.0 - 0.5 K/uL Final  . Basophils Relative 12/11/2019 0  % Final  . Basophils Absolute 12/11/2019 0.0  0.0 - 0.1 K/uL Final  . Immature Granulocytes 12/11/2019 0  % Final  . Abs Immature Granulocytes 12/11/2019 0.02  0.00 - 0.07 K/uL Final   Performed at [facility], [address], [place],  [address]    Assessment:  Morgan Schaefer is a 87 y.o. 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  Biopsy of the left nipple on 11/07/2019 revealed Paget's disease.    Left diagnostic mammogram on 12/29/2019 revealed an indeterminate 8 mm focal asymmetry with associated coarse heterogeneous calcifications in the central anterior left breast without a sonographic correlate. There was no evidence of left axillary lymphadenopathy.    Prior mammograms from 11/02/2013 an 11/01/2012 revealed the asymmetry in the central anterior left breast has been stable since 2014.  The scant calcifications associated with the asymmetry had developed over time (one calcification was seen on the 2014 exam) but are coarse and heterogeneous as expected with a benign involuting fibroadenoma. Overall, this finding has a benign appearance  She has a history of right breast cancer (no details available) s/p right mastectomy in 1980.    She has Alzheimer's dementia.  She has a suprapubic catheter for urinary retention.   Catheter was placed in 03/2019 in [place] secondary to difficulty with self catheterization (non-sterile technique and recurrent infections).  Symptomatically, she has been "okay." She does not know why she is here today. She denies any problems with her nipple.   Plan: 1.   Paget's disease of left nipple             Mammogram on 12/29/2019 revealed an indeterminate 8 mm focal asymmetry associated with calcifications.   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   MRI has been avoided secondary to her age  Re-address surgery (lumpectomy + radiation or mastectomy).   Chrystal recommends radiation if lumpectomy pursued.             Patient is unaware of current clinical condition despite prior conversations.  Review tumor board recommendations.  Patient and caregiver have not made a decision but wish to readdress with Dr. Bary Castilla. 2.   Please schedule an appt (? video visit) with Dr Bary Castilla for the week of March 7th. 3.   RTC 1 month later for MD assessment and possible review of pathology s/p surgery.  I discussed the assessment and treatment plan with the patient.  The patient was provided an opportunity to ask questions and all were answered.  The patient agreed with the plan and demonstrated an understanding of the instructions.  The patient was advised  to call back if the symptoms worsen or if the condition fails to improve as anticipated.  I provided 21 minutes of face-to-face time during this this encounter and > 50% was spent counseling as documented under my assessment and plan.   Holland Kotter C. Mike Gip, MD, PhD    02/15/2020, 1:02 PM  I, Mirian Mo Tufford, am acting as Education administrator for [employer]. Mike Gip, MD, PhD.  I, Kyna Blahnik C. Mike Gip, MD, have reviewed the above documentation for accuracy and completeness, and I agree with the above.

## 2020-02-15 ENCOUNTER — Encounter: Payer: Self-pay | Admitting: Hematology and Oncology

## 2020-02-15 ENCOUNTER — Other Ambulatory Visit: Payer: Self-pay

## 2020-02-15 ENCOUNTER — Inpatient Hospital Stay: Payer: Medicare Other | Attending: Hematology and Oncology | Admitting: Hematology and Oncology

## 2020-02-15 VITALS — BP 145/66 | HR 92 | Temp 98.1°F | Resp 16 | Wt 144.0 lb

## 2020-02-15 DIAGNOSIS — C50012 Malignant neoplasm of nipple and areola, left female breast: Secondary | ICD-10-CM | POA: Insufficient documentation

## 2020-02-15 DIAGNOSIS — Z7951 Long term (current) use of inhaled steroids: Secondary | ICD-10-CM | POA: Insufficient documentation

## 2020-02-15 DIAGNOSIS — Z7982 Long term (current) use of aspirin: Secondary | ICD-10-CM | POA: Diagnosis not present

## 2020-02-15 DIAGNOSIS — Z79899 Other long term (current) drug therapy: Secondary | ICD-10-CM | POA: Diagnosis not present

## 2020-02-15 DIAGNOSIS — Z8249 Family history of ischemic heart disease and other diseases of the circulatory system: Secondary | ICD-10-CM | POA: Insufficient documentation

## 2020-02-15 DIAGNOSIS — E785 Hyperlipidemia, unspecified: Secondary | ICD-10-CM | POA: Diagnosis not present

## 2020-02-15 DIAGNOSIS — E079 Disorder of thyroid, unspecified: Secondary | ICD-10-CM | POA: Insufficient documentation

## 2020-03-11 ENCOUNTER — Encounter: Payer: Self-pay | Admitting: Urology

## 2020-03-11 ENCOUNTER — Ambulatory Visit (INDEPENDENT_AMBULATORY_CARE_PROVIDER_SITE_OTHER): Payer: Medicare Other | Admitting: Urology

## 2020-03-11 ENCOUNTER — Other Ambulatory Visit: Payer: Self-pay

## 2020-03-11 VITALS — BP 117/66 | HR 87 | Ht 62.0 in | Wt 140.0 lb

## 2020-03-11 DIAGNOSIS — Z9359 Other cystostomy status: Secondary | ICD-10-CM

## 2020-03-11 NOTE — Progress Notes (Signed)
Suprapubic Cath Change  Patient is present today for a suprapubic catheter change due to urinary retention.  8 ml of water was drained from the balloon, a 18 FR foley cath was removed from the tract with out difficulty.  Site was cleaned and prepped in a sterile fashion with betadine.  A 18 FR foley cath was replaced into the tract no complications were noted. Urine return was noted, 10 ml of sterile water was inflated into the balloon and a leg bag was attached for drainage.  Patient tolerated well.   Performed by: Zara Council, PA-C   Follow up: One month for suprapubic catheter exchange.  They will report if the pink clear urine does not clear by tomorrow.

## 2020-03-12 ENCOUNTER — Ambulatory Visit: Payer: Medicare Other | Admitting: Urology

## 2020-03-31 NOTE — Progress Notes (Signed)
Suprapubic Cath Change  Patient is present today for a suprapubic catheter change due to urinary retention.  9 ml of water was drained from the balloon, a 18 FR foley cath was removed from the tract with out difficulty.  Site was cleaned and prepped in a sterile fashion with betadine.  A 18 FR foley cath was replaced into the tract no complications were noted. Urine return was noted, 10 ml of sterile water was inflated into the balloon and a leg bag was attached for drainage.  Patient tolerated well.   Performed by: Myself  Follow up: three weeks as she is having more issues with catheter clogging

## 2020-04-01 ENCOUNTER — Encounter: Payer: Self-pay | Admitting: Urology

## 2020-04-01 ENCOUNTER — Other Ambulatory Visit: Payer: Self-pay

## 2020-04-01 ENCOUNTER — Ambulatory Visit (INDEPENDENT_AMBULATORY_CARE_PROVIDER_SITE_OTHER): Payer: Medicare Other | Admitting: Urology

## 2020-04-01 VITALS — BP 130/85 | HR 35 | Ht 62.0 in | Wt 141.0 lb

## 2020-04-01 DIAGNOSIS — Z9359 Other cystostomy status: Secondary | ICD-10-CM | POA: Diagnosis not present

## 2020-04-08 ENCOUNTER — Ambulatory Visit: Payer: Medicare Other | Admitting: Urology

## 2020-04-09 ENCOUNTER — Other Ambulatory Visit: Payer: Self-pay

## 2020-04-09 ENCOUNTER — Encounter: Payer: Self-pay | Admitting: Internal Medicine

## 2020-04-09 ENCOUNTER — Other Ambulatory Visit: Payer: Self-pay | Admitting: Internal Medicine

## 2020-04-09 ENCOUNTER — Ambulatory Visit (INDEPENDENT_AMBULATORY_CARE_PROVIDER_SITE_OTHER): Payer: Medicare Other | Admitting: Internal Medicine

## 2020-04-09 VITALS — BP 110/74 | HR 80 | Temp 97.6°F | Ht 62.0 in | Wt 141.0 lb

## 2020-04-09 DIAGNOSIS — E039 Hypothyroidism, unspecified: Secondary | ICD-10-CM | POA: Diagnosis not present

## 2020-04-09 DIAGNOSIS — J410 Simple chronic bronchitis: Secondary | ICD-10-CM | POA: Diagnosis not present

## 2020-04-09 DIAGNOSIS — C50012 Malignant neoplasm of nipple and areola, left female breast: Secondary | ICD-10-CM

## 2020-04-09 DIAGNOSIS — H9190 Unspecified hearing loss, unspecified ear: Secondary | ICD-10-CM | POA: Insufficient documentation

## 2020-04-09 DIAGNOSIS — R339 Retention of urine, unspecified: Secondary | ICD-10-CM

## 2020-04-09 DIAGNOSIS — R5383 Other fatigue: Secondary | ICD-10-CM

## 2020-04-09 LAB — POCT URINALYSIS DIPSTICK
Bilirubin, UA: NEGATIVE
Blood, UA: NEGATIVE
Glucose, UA: NEGATIVE
Ketones, UA: NEGATIVE
Nitrite, UA: NEGATIVE
Protein, UA: POSITIVE — AB
Spec Grav, UA: <=1.005 — AB (ref 1.010–1.025)
Urobilinogen, UA: 0.2 E.U./dL
pH, UA: 8.5 — AB (ref 5.0–8.0)

## 2020-04-09 NOTE — Progress Notes (Signed)
Date:  04/09/2020   Name:  Morgan Schaefer   DOB:  May 17, 1925   MRN:  [id number]   Chief Complaint: Allergies (Records from [facility], pt is allergic to chocolate but has been eating chocolate, like a allergy test) and Fatigue (X3 weeks, has been feeling more tired, pt hasn't been eating a lot stated she was to tired to chew her food one day, has been sleeping more /)  HPI Allergies - hx of several food allergies from 60+ years ago.  Has been eating these things with no reaction, esp peanuts and chocolate.  Fatigue - sleeping more, not eating. She denies depression just does not have an appetite.  No abdominal pain.  Has a suprapubic catheter so no dysuria.  Lab Results  Component Value Date   CREATININE 1.00 12/11/2019   BUN 32 (H) 12/11/2019   NA 139 12/11/2019   K 4.5 12/11/2019   CL 105 12/11/2019   CO2 25 12/11/2019   Lab Results  Component Value Date   CHOL 199 08/01/2019   HDL 78 08/01/2019   LDLCALC 94 08/01/2019   TRIG 159 (H) 08/01/2019   CHOLHDL 2.6 08/01/2019   Lab Results  Component Value Date   TSH 3.830 08/01/2019   No results found for: HGBA1C Lab Results  Component Value Date   WBC 8.0 12/11/2019   HGB 13.9 12/11/2019   HCT 42.9 12/11/2019   MCV 92.3 12/11/2019   PLT 267 12/11/2019   Lab Results  Component Value Date   ALT 13 12/11/2019   AST 19 12/11/2019   ALKPHOS 72 12/11/2019   BILITOT 0.6 12/11/2019     Review of Systems  Constitutional: Positive for appetite change. Negative for diaphoresis, fatigue, fever and unexpected weight change.  HENT: Negative for trouble swallowing.   Respiratory: Positive for wheezing (per caregiver). Negative for cough, chest tightness and shortness of breath.   Cardiovascular: Negative for chest pain and leg swelling.  Gastrointestinal: Negative for abdominal pain.  Musculoskeletal: Positive for arthralgias and gait problem (uses rolator).  Neurological: Negative for dizziness, light-headedness and headaches.     Patient Active Problem List   Diagnosis Date Noted  . Hard of hearing 04/09/2020  . Frequent ventricular premature beats 01/16/2020  . Paget's carcinoma of the nipple, left ([place]) 12/11/2019  . Dementia ([place]) 08/01/2019  . Constipation 08/01/2019  . Benign essential HTN 07/31/2019  . Hyperlipidemia, mixed 07/31/2019  . Hypothyroidism (acquired) 07/31/2019  . Urinary retention 07/31/2019  . Suprapubic catheter ([place]) 07/31/2019  . Hyponatremia 07/31/2019  . Barrett's esophagus 07/31/2019  . Anemia 07/31/2019  . Chronic bronchitis ([place]) 07/31/2019  . GERD (gastroesophageal reflux disease) 07/31/2019    Allergies  Allergen Reactions  . Ciprofloxacin Other (See Comments)    Unkown Other reaction(s): Unknown  . Penicillins Other (See Comments)    unknown  . Sulfa Antibiotics Other (See Comments)    UNKNOWN    Past Surgical History:  Procedure Laterality Date  . INTERSTIM IMPLANT PLACEMENT  05/2010  . INTRAOCULAR LENS IMPLANT, SECONDARY  2002  . MASTECTOMY Right 1980    Social History   Tobacco Use  . Smoking status: Never Smoker  . Smokeless tobacco: Never Used  Vaping Use  . Vaping Use: Never used  Substance Use Topics  . Alcohol use: Not Currently  . Drug use: Not Currently     Medication list has been reviewed and updated.  Current Meds  Medication Sig  . amLODipine (NORVASC) 5 MG tablet Take 5 mg  by mouth daily.  Marland Kitchen aspirin EC 81 MG tablet Take 81 mg by mouth daily. Swallow whole.  . fluticasone-salmeterol (ADVAIR HFA) 115-21 MCG/ACT inhaler Inhale 2 puffs into the lungs 2 (two) times daily as needed.  Marland Kitchen guaiFENesin (MUCINEX) 600 MG 12 hr tablet Take 1 tablet by mouth 2 (two) times daily.  Marland Kitchen levothyroxine (SYNTHROID) 25 MCG tablet Take 1 tablet (25 mcg total) by mouth daily before breakfast.  . loratadine (CLARITIN) 10 MG tablet Take 10 mg by mouth daily.  . montelukast (SINGULAIR) 10 MG tablet Take 10 mg by mouth at bedtime.  . Multiple  Vitamins-Minerals (MULTIVITAMINS THER. W/MINERALS) TABS tablet Take 1 tablet by mouth daily.  . Multiple Vitamins-Minerals (OCUVITE ADULT 50+ PO) Take 1 capsule by mouth daily.  . nitroGLYCERIN (NITROSTAT) 0.4 MG SL tablet Place 1 tablet (0.4 mg total) under the tongue every 5 (five) minutes as needed for chest pain.  . pantoprazole (PROTONIX) 40 MG tablet Take 1 tablet (40 mg total) by mouth daily.  . polyethylene glycol (MIRALAX / GLYCOLAX) 17 g packet Take 17 g by mouth 3 (three) times a week.  . saccharomyces boulardii (FLORASTOR) 250 MG capsule Take 250 mg by mouth daily. ( Probiotics OTC )  . simvastatin (ZOCOR) 20 MG tablet Take 20 mg by mouth daily.    PHQ 2/9 Scores 04/09/2020 01/16/2020 01/16/2020 08/01/2019  PHQ - 2 Score 0 0 0 0  PHQ- 9 Score 9 9 0 0    GAD 7 : Generalized Anxiety Score 04/09/2020 01/16/2020 01/16/2020 08/01/2019  Nervous, Anxious, on Edge 0 0 0 0  Control/stop worrying 0 0 0 0  Worry too much - different things 0 0 0 0  Trouble relaxing 0 0 0 0  Restless 0 0 0 0  Easily annoyed or irritable 0 0 0 0  Afraid - awful might happen 0 0 0 0  Total GAD 7 Score 0 0 0 0  Anxiety Difficulty - Not difficult at all Not difficult at all Not difficult at all    BP Readings from Last 3 Encounters:  04/09/20 110/74  04/01/20 130/85  03/11/20 117/66    Physical Exam Constitutional:      Appearance: Normal appearance.  Cardiovascular:     Rate and Rhythm: Normal rate and regular rhythm.     Pulses: Normal pulses.  Pulmonary:     Effort: Pulmonary effort is normal.     Breath sounds: Normal breath sounds. No wheezing or rhonchi.  Abdominal:     Palpations: Abdomen is soft.     Tenderness: There is no abdominal tenderness.  Musculoskeletal:     Cervical back: Normal range of motion.  Lymphadenopathy:     Cervical: No cervical adenopathy.  Skin:    General: Skin is warm and dry.  Neurological:     Mental Status: She is alert.  Psychiatric:        Attention and  Perception: Attention normal.        Mood and Affect: Mood normal.        Cognition and Memory: Cognition is impaired. Memory is impaired.     Wt Readings from Last 3 Encounters:  04/09/20 141 lb (64 kg)  04/01/20 141 lb (64 kg)  03/11/20 140 lb (63.5 kg)    BP 110/74   Pulse 80   Temp 97.6 F (36.4 C) (Oral)   Ht 5\' 2"  (1.575 m)   Wt 141 lb (64 kg)   SpO2 97%   BMI 25.79 kg/m  Assessment and Plan: 1. Fatigue, unspecified type Unclear how severe or limiting - will get basic labs Pt does not appear to be depressed but she did get a new roommate  - Comprehensive metabolic panel - CBC with Differential/Platelet  2. Paget's carcinoma of the nipple, left (HCC) No deep involvement noted - will likely treat with AI and close surveillance  3. Simple chronic bronchitis (HCC) Stable without evidence of symptoms at this time  4. Hypothyroidism (acquired) Supplemented - check labs for stability - TSH + free T4  5. Urinary retention With suprapubic catheter and evidence of bacteruria/triple phosphate crystals Will get culture then treat accordingly - POCT urinalysis dipstick - Urine Culture  Pt has been eating chocolate and peanuts for some time with no apparent adverse reactions.  I see no reason to limit intake.  Partially dictated using Editor, commissioning. Any errors are unintentional.  Halina Maidens, MD [facility]  04/09/2020

## 2020-04-10 LAB — CBC WITH DIFFERENTIAL/PLATELET
Basophils Absolute: 0 10*3/uL (ref 0.0–0.2)
Basos: 0 %
EOS (ABSOLUTE): 0.3 10*3/uL (ref 0.0–0.4)
Eos: 4 %
Hematocrit: 43.4 % (ref 34.0–46.6)
Hemoglobin: 14 g/dL (ref 11.1–15.9)
Immature Grans (Abs): 0 10*3/uL (ref 0.0–0.1)
Immature Granulocytes: 0 %
Lymphocytes Absolute: 1.5 10*3/uL (ref 0.7–3.1)
Lymphs: 19 %
MCH: 29.5 pg (ref 26.6–33.0)
MCHC: 32.3 g/dL (ref 31.5–35.7)
MCV: 92 fL (ref 79–97)
Monocytes Absolute: 0.5 10*3/uL (ref 0.1–0.9)
Monocytes: 7 %
Neutrophils Absolute: 5.2 10*3/uL (ref 1.4–7.0)
Neutrophils: 70 %
Platelets: 241 10*3/uL (ref 150–450)
RBC: 4.74 x10E6/uL (ref 3.77–5.28)
RDW: 13.1 % (ref 11.7–15.4)
WBC: 7.5 10*3/uL (ref 3.4–10.8)

## 2020-04-10 LAB — COMPREHENSIVE METABOLIC PANEL
ALT: 7 IU/L (ref 0–32)
AST: 17 IU/L (ref 0–40)
Albumin/Globulin Ratio: 1.6 (ref 1.2–2.2)
Albumin: 4.1 g/dL (ref 3.5–4.6)
Alkaline Phosphatase: 95 IU/L (ref 44–121)
BUN/Creatinine Ratio: 33 — ABNORMAL HIGH (ref 12–28)
BUN: 29 mg/dL (ref 10–36)
Bilirubin Total: 0.2 mg/dL (ref 0.0–1.2)
CO2: 21 mmol/L (ref 20–29)
Calcium: 9.9 mg/dL (ref 8.7–10.3)
Chloride: 105 mmol/L (ref 96–106)
Creatinine, Ser: 0.89 mg/dL (ref 0.57–1.00)
Globulin, Total: 2.5 g/dL (ref 1.5–4.5)
Glucose: 96 mg/dL (ref 65–99)
Potassium: 5.2 mmol/L (ref 3.5–5.2)
Sodium: 142 mmol/L (ref 134–144)
Total Protein: 6.6 g/dL (ref 6.0–8.5)
eGFR: 61 mL/min/{1.73_m2} (ref >59)

## 2020-04-10 LAB — TSH+FREE T4
Free T4: 1.38 ng/dL (ref 0.82–1.77)
TSH: 3.61 u[IU]/mL (ref 0.450–4.500)

## 2020-04-12 ENCOUNTER — Telehealth: Payer: Self-pay | Admitting: Internal Medicine

## 2020-04-12 ENCOUNTER — Other Ambulatory Visit: Payer: Self-pay | Admitting: Internal Medicine

## 2020-04-12 DIAGNOSIS — N3000 Acute cystitis without hematuria: Secondary | ICD-10-CM

## 2020-04-12 LAB — URINE CULTURE

## 2020-04-12 MED ORDER — CEFUROXIME AXETIL 250 MG PO TABS: 250.0000 mg | ORAL_TABLET | Freq: Two times a day (BID) | ORAL | 0 refills | Status: AC

## 2020-04-12 NOTE — Progress Notes (Signed)
Left message for patient's guardian, Dennie Bible, to return call. Dr. Army Melia sent in [place] 250 twice daily for 7 days to [facility] for positive urine culture results.  Routed result note to [facility] Nurse Triage for follow up when patient returns call to clinic. Nurse may give results to patient if they return call. CRM created for this message.

## 2020-04-12 NOTE — Telephone Encounter (Signed)
Sarajane Jews and Darrick Meigs called to report that the patient has penicillin allergies and that the family would prefer an alternative option to the medication listed below  cefUROXime (CEFTIN) 250 MG tablet    [place], [address]  [place] [address]  Phone: [phone] Fax: [phone]

## 2020-04-15 ENCOUNTER — Other Ambulatory Visit
Admission: RE | Admit: 2020-04-15 | Discharge: 2020-04-15 | Disposition: A | Discharge status: Home / Self Care | Payer: Medicare Other | Source: Ambulatory Visit | Attending: Urology | Admitting: Urology

## 2020-04-15 ENCOUNTER — Encounter: Payer: Self-pay | Admitting: Urology

## 2020-04-15 ENCOUNTER — Telehealth: Payer: Self-pay | Admitting: Hematology and Oncology

## 2020-04-15 ENCOUNTER — Telehealth: Payer: Self-pay

## 2020-04-15 ENCOUNTER — Other Ambulatory Visit: Payer: Self-pay

## 2020-04-15 ENCOUNTER — Ambulatory Visit (INDEPENDENT_AMBULATORY_CARE_PROVIDER_SITE_OTHER): Payer: Medicare Other | Admitting: Urology

## 2020-04-15 VITALS — BP 138/57 | HR 96 | Ht 62.0 in | Wt 140.0 lb

## 2020-04-15 DIAGNOSIS — R3989 Other symptoms and signs involving the genitourinary system: Secondary | ICD-10-CM | POA: Insufficient documentation

## 2020-04-15 DIAGNOSIS — N390 Urinary tract infection, site not specified: Secondary | ICD-10-CM

## 2020-04-15 DIAGNOSIS — T83510A Infection and inflammatory reaction due to cystostomy catheter, initial encounter: Secondary | ICD-10-CM

## 2020-04-15 DIAGNOSIS — Z9359 Other cystostomy status: Secondary | ICD-10-CM

## 2020-04-15 LAB — URINALYSIS, COMPLETE (UACMP) WITH MICROSCOPIC
Bilirubin Urine: NEGATIVE
Glucose, UA: NEGATIVE mg/dL
Ketones, ur: NEGATIVE mg/dL
Nitrite: NEGATIVE
Protein, ur: 30 mg/dL — AB
Specific Gravity, Urine: 1.015 (ref 1.005–1.030)
Squamous Epithelial / HPF: NONE SEEN (ref 0–5)
WBC, UA: >50 WBC/hpf (ref 0–5)
pH: 6 (ref 5.0–8.0)

## 2020-04-15 NOTE — Telephone Encounter (Signed)
This is a duplicate message/ crm. Sent other message to Dr Army Melia for review to change Rx.

## 2020-04-15 NOTE — Progress Notes (Signed)
Suprapubic Cath Change  Patient is present today for a suprapubic catheter change due to urinary retention.  9 ml of water was drained from the balloon, an 18 FR foley cath was removed from the tract with out difficulty.  Site was cleaned and prepped in a sterile fashion with betadine.  A 18 FR foley cath was replaced into the tract no complications were noted. Urine return was noted, 10 ml of sterile water was inflated into the balloon and a leg bag was attached for drainage.  Patient tolerated well.   Performed by: Zara Council, PA-C  Follow up: 3 weeks.    She was recently seen by her PCP for fatigue and bladder pain.    Dx with UTI and was prescribed Ceftin 250 mg, BID x 7 day.  This has not been started as of this visit.  Urine culture was positive for proteus mirabilis resistant to ampicillin, nitrofurantoin and tetracycline.    Unfortunately, the urine that was sent for culture was taken from the leg bag and has a high risk for contamination.  I will submit another urine for culture from the new SPT that was placed today.  They will not start the antibiotic until we get the culture back.   I spent 15 minutes on the day of the encounter to include pre-visit record review, face-to-face time with the patient, and post-visit ordering of tests.  They are also having issues with urine coming out of the urethra and I noted the old catheter was plugged with mucous.  We will start irrigating every day to reduce the mucous plugs.

## 2020-04-15 NOTE — Telephone Encounter (Signed)
Please advise previous msg.

## 2020-04-15 NOTE — Telephone Encounter (Unsigned)
Copied from [place] [id number]. Topic: General - Other >> Apr 12, 2020  6:03 PM Pawlus, Brayton Layman A wrote: Reason for CRM: Caller stated the pharmacy would not fill cefUROXime (CEFTIN) 250 MG tablet because the pt has an allergy to penicillin and the pharmacist was worried she would have a reaction. Pt wanted to know if something else could be called in.

## 2020-04-15 NOTE — Telephone Encounter (Signed)
Pt called to cancel her 04/17/2020 follow-up appt for path results. Pt did want to reschedule the appt at this time. Is seeking a 2nd opinion at [facility]. Will call back to reschedule if she would like to return.

## 2020-04-17 ENCOUNTER — Ambulatory Visit: Payer: Medicare Other | Admitting: Hematology and Oncology

## 2020-04-18 LAB — URINE CULTURE: Culture: >=100000 — AB

## 2020-04-22 ENCOUNTER — Ambulatory Visit: Payer: Medicare Other | Admitting: Urology

## 2020-05-06 ENCOUNTER — Encounter: Payer: Self-pay | Admitting: Urology

## 2020-05-06 ENCOUNTER — Other Ambulatory Visit: Payer: Self-pay

## 2020-05-06 ENCOUNTER — Ambulatory Visit (INDEPENDENT_AMBULATORY_CARE_PROVIDER_SITE_OTHER): Payer: Medicare Other | Admitting: Urology

## 2020-05-06 VITALS — BP 121/71 | HR 73 | Ht 62.0 in | Wt 140.0 lb

## 2020-05-06 DIAGNOSIS — R198 Other specified symptoms and signs involving the digestive system and abdomen: Secondary | ICD-10-CM

## 2020-05-06 DIAGNOSIS — Z9359 Other cystostomy status: Secondary | ICD-10-CM | POA: Diagnosis not present

## 2020-05-06 NOTE — Progress Notes (Signed)
05/06/2020 2:42 PM   Morgan Schaefer [birth year] [id number]  Referring provider: Glean Hess, MD [address] [place],  [place] [address]  Chief Complaint  Patient presents with  . Follow-up    Cath Change   Urological history: 1. Urinary retention -managed with SPT changed q 3 weeks  HPI: Morgan Schaefer is a 87 y.o. female who presented today for SPT exchange, but she started to have blood in her umbilicus yesterday.  Her caregiver, Morgan Schaefer, noted blood emanating from her umbilicus.  It has been weeping, but no significant blood loss has been noted.    Mrs. Glendenning has been experiencing fatigue, loss of appetite and nausea for a few weeks which was attributed to an UTI.    Morgan Schaefer has denied seeing any blood in the stool or urine.      PMH: Past Medical History:  Diagnosis Date  . Hyperlipidemia   . Paget disease of breast, left ([place])   . Thyroid disease     Surgical History: Past Surgical History:  Procedure Laterality Date  . INTERSTIM IMPLANT PLACEMENT  05/2010  . INTRAOCULAR LENS IMPLANT, SECONDARY  2002  . MASTECTOMY Right 1980    Home Medications:  Allergies as of 05/06/2020      Reactions   Ciprofloxacin Other (See Comments)   Unkown Other reaction(s): Unknown   Penicillins Other (See Comments)   unknown   Sulfa Antibiotics Other (See Comments)   UNKNOWN      Medication List       Accurate as of May 06, 2020  2:42 PM. If you have any questions, ask your nurse or doctor.        amLODipine 5 MG tablet Commonly known as: NORVASC Take 5 mg by mouth daily.   aspirin EC 81 MG tablet Take 81 mg by mouth daily. Swallow whole.   fluticasone-salmeterol 115-21 MCG/ACT inhaler Commonly known as: ADVAIR HFA Inhale 2 puffs into the lungs 2 (two) times daily as needed.   guaiFENesin 600 MG 12 hr tablet Commonly known as: MUCINEX Take 1 tablet by mouth 2 (two) times daily.   levothyroxine 25 MCG tablet Commonly known as:  SYNTHROID Take 1 tablet (25 mcg total) by mouth daily before breakfast.   loratadine 10 MG tablet Commonly known as: CLARITIN Take 10 mg by mouth daily.   montelukast 10 MG tablet Commonly known as: SINGULAIR Take 10 mg by mouth at bedtime.   nitroGLYCERIN 0.4 MG SL tablet Commonly known as: NITROSTAT Place 1 tablet (0.4 mg total) under the tongue every 5 (five) minutes as needed for chest pain.   OCUVITE ADULT 50+ PO Take 1 capsule by mouth daily.   multivitamins ther. w/minerals Tabs tablet Take 1 tablet by mouth daily.   pantoprazole 40 MG tablet Commonly known as: PROTONIX Take 1 tablet (40 mg total) by mouth daily.   polyethylene glycol 17 g packet Commonly known as: MIRALAX / GLYCOLAX Take 17 g by mouth 3 (three) times a week.   saccharomyces boulardii 250 MG capsule Commonly known as: FLORASTOR Take 250 mg by mouth daily. ( Probiotics OTC )   simvastatin 20 MG tablet Commonly known as: ZOCOR Take 20 mg by mouth daily.       Allergies:  Allergies  Allergen Reactions  . Ciprofloxacin Other (See Comments)    Unkown Other reaction(s): Unknown  . Penicillins Other (See Comments)    unknown  . Sulfa Antibiotics Other (See Comments)    UNKNOWN  Family History: Family History  Problem Relation Age of Onset  . Heart disease Mother   . Heart disease Father     Social History:  reports that she has never smoked. She has never used smokeless tobacco. She reports previous alcohol use. She reports previous drug use.  ROS: Pertinent ROS in HPI  Physical Exam: BP 121/71   Pulse 73   Ht 5\' 2"  (1.575 m)   Wt 140 lb (63.5 kg)   BMI 25.61 kg/m   Constitutional:  Well nourished. Alert and oriented, No acute distress. HEENT: [place] AT, mask in place.  Trachea midline. Cardiovascular: No clubbing, cyanosis, or edema. Respiratory: Normal respiratory effort, no increased work of breathing. GI: Blood is seen seeping from the umbilicus.  An indurated mass is  palpated at the umbilicus.  GU: No CVA tenderness.  No bladder fullness or masses.  SPT site is clean and dry.  SPT in place draining clear urine.   Neurologic: Grossly intact, no focal deficits, moving all 4 extremities. Psychiatric: Normal mood and affect.   Laboratory Data: Lab Results  Component Value Date   WBC 7.5 04/09/2020   HGB 14.0 04/09/2020   HCT 43.4 04/09/2020   MCV 92 04/09/2020   PLT 241 04/09/2020    Lab Results  Component Value Date   CREATININE 0.89 04/09/2020    Lab Results  Component Value Date   TSH 3.610 04/09/2020    Lab Results  Component Value Date   AST 17 04/09/2020   Lab Results  Component Value Date   ALT 7 04/09/2020  I have reviewed the labs.   Pertinent Imaging: No recent imaging  Suprapubic Cath Change  Patient is present today for a suprapubic catheter change due to urinary retention.  9 ml of water was drained from the balloon, an 18 FR foley cath was removed from the tract with out difficulty.  Site was cleaned and prepped in a sterile fashion with betadine.  An 7 FR silicone foley cath was replaced into the tract no complications were noted. Urine return was noted, 10 ml of sterile water was inflated into the balloon and a leg bag was attached for drainage.  Patient tolerated well.   Performed by: Myself   Assessment & Plan:    1. Umbilical bleeding -explained that this is very concerning for a malignancy colon vs urachal  -CT HEMATURIA WORKUP; Future -I will call Morgan Schaefer with results  2. Urinary retention -SPT exchanged today -RTC in three weeks for next exchange   Return for pending CT results .  These notes generated with voice recognition software. I apologize for typographical errors.  Zara Council, PA-C  [facility] [address]  [place], [place] [address] [phone]

## 2020-05-09 ENCOUNTER — Other Ambulatory Visit: Payer: Self-pay | Admitting: Urology

## 2020-05-09 ENCOUNTER — Other Ambulatory Visit: Payer: Self-pay

## 2020-05-09 ENCOUNTER — Ambulatory Visit
Admission: RE | Admit: 2020-05-09 | Discharge: 2020-05-09 | Disposition: A | Discharge status: Home / Self Care | Payer: Medicare Other | Source: Ambulatory Visit | Attending: Urology | Admitting: Urology

## 2020-05-09 ENCOUNTER — Telehealth: Payer: Self-pay

## 2020-05-09 DIAGNOSIS — C786 Secondary malignant neoplasm of retroperitoneum and peritoneum: Secondary | ICD-10-CM

## 2020-05-09 DIAGNOSIS — R198 Other specified symptoms and signs involving the digestive system and abdomen: Secondary | ICD-10-CM | POA: Insufficient documentation

## 2020-05-09 DIAGNOSIS — J42 Unspecified chronic bronchitis: Secondary | ICD-10-CM

## 2020-05-09 HISTORY — DX: Unspecified asthma, uncomplicated: J45.909

## 2020-05-09 MED ORDER — SPACER/AERO-HOLDING CHAMBERS DEVI: 1 refills | Status: AC

## 2020-05-09 MED ORDER — IOHEXOL 300 MG/ML  SOLN
150.0000 mL | Freq: Once | INTRAMUSCULAR | Status: AC | PRN
  Administered 2020-05-09: 125 mL via INTRAVENOUS

## 2020-05-09 NOTE — Telephone Encounter (Signed)
Prescription sent to the pharmacy electronically.

## 2020-05-09 NOTE — Telephone Encounter (Signed)
Copied from [place] [id number]. Topic: General - Inquiry >> May 09, 2020 11:32 AM Greggory Keen D wrote: Reason for CRM: Pt needs rx for the universal spacer fo her inhaler.  [facility] [place]

## 2020-05-13 ENCOUNTER — Inpatient Hospital Stay: Payer: Medicare Other | Attending: Hematology and Oncology | Admitting: Oncology

## 2020-05-13 ENCOUNTER — Encounter: Payer: Self-pay | Admitting: Oncology

## 2020-05-13 ENCOUNTER — Other Ambulatory Visit: Payer: Self-pay | Admitting: Urology

## 2020-05-13 ENCOUNTER — Inpatient Hospital Stay: Payer: Medicare Other

## 2020-05-13 ENCOUNTER — Other Ambulatory Visit: Payer: Self-pay

## 2020-05-13 VITALS — BP 127/50 | HR 48 | Temp 99.3°F | Resp 18 | Wt 138.1 lb

## 2020-05-13 DIAGNOSIS — C50012 Malignant neoplasm of nipple and areola, left female breast: Secondary | ICD-10-CM

## 2020-05-13 DIAGNOSIS — C786 Secondary malignant neoplasm of retroperitoneum and peritoneum: Secondary | ICD-10-CM

## 2020-05-13 DIAGNOSIS — F0390 Unspecified dementia without behavioral disturbance: Secondary | ICD-10-CM | POA: Diagnosis not present

## 2020-05-13 DIAGNOSIS — Z7982 Long term (current) use of aspirin: Secondary | ICD-10-CM | POA: Insufficient documentation

## 2020-05-13 DIAGNOSIS — R1900 Intra-abdominal and pelvic swelling, mass and lump, unspecified site: Secondary | ICD-10-CM | POA: Insufficient documentation

## 2020-05-13 DIAGNOSIS — Z66 Do not resuscitate: Secondary | ICD-10-CM | POA: Insufficient documentation

## 2020-05-13 DIAGNOSIS — Z9011 Acquired absence of right breast and nipple: Secondary | ICD-10-CM | POA: Insufficient documentation

## 2020-05-13 DIAGNOSIS — Z515 Encounter for palliative care: Secondary | ICD-10-CM | POA: Diagnosis not present

## 2020-05-13 DIAGNOSIS — Z853 Personal history of malignant neoplasm of breast: Secondary | ICD-10-CM | POA: Insufficient documentation

## 2020-05-13 DIAGNOSIS — R339 Retention of urine, unspecified: Secondary | ICD-10-CM | POA: Insufficient documentation

## 2020-05-13 DIAGNOSIS — Z7189 Other specified counseling: Secondary | ICD-10-CM

## 2020-05-13 DIAGNOSIS — E785 Hyperlipidemia, unspecified: Secondary | ICD-10-CM | POA: Diagnosis not present

## 2020-05-13 DIAGNOSIS — Z79899 Other long term (current) drug therapy: Secondary | ICD-10-CM | POA: Insufficient documentation

## 2020-05-13 LAB — COMPREHENSIVE METABOLIC PANEL
ALT: 10 U/L (ref 0–44)
AST: 18 U/L (ref 15–41)
Albumin: 3.9 g/dL (ref 3.5–5.0)
Alkaline Phosphatase: 73 U/L (ref 38–126)
Anion gap: 12 (ref 5–15)
BUN: 33 mg/dL — ABNORMAL HIGH (ref 8–23)
CO2: 22 mmol/L (ref 22–32)
Calcium: 9.4 mg/dL (ref 8.9–10.3)
Chloride: 106 mmol/L (ref 98–111)
Creatinine, Ser: 0.74 mg/dL (ref 0.44–1.00)
GFR, Estimated: >60 mL/min (ref >60)
Glucose, Bld: 98 mg/dL (ref 70–99)
Potassium: 4.7 mmol/L (ref 3.5–5.1)
Sodium: 140 mmol/L (ref 135–145)
Total Bilirubin: 0.4 mg/dL (ref 0.3–1.2)
Total Protein: 7.4 g/dL (ref 6.5–8.1)

## 2020-05-13 LAB — CBC WITH DIFFERENTIAL/PLATELET
Abs Immature Granulocytes: 0.01 10*3/uL (ref 0.00–0.07)
Basophils Absolute: 0 10*3/uL (ref 0.0–0.1)
Basophils Relative: 1 %
Eosinophils Absolute: 0.3 10*3/uL (ref 0.0–0.5)
Eosinophils Relative: 4 %
HCT: 42.9 % (ref 36.0–46.0)
Hemoglobin: 13.9 g/dL (ref 12.0–15.0)
Immature Granulocytes: 0 %
Lymphocytes Relative: 20 %
Lymphs Abs: 1.5 10*3/uL (ref 0.7–4.0)
MCH: 29.3 pg (ref 26.0–34.0)
MCHC: 32.4 g/dL (ref 30.0–36.0)
MCV: 90.3 fL (ref 80.0–100.0)
Monocytes Absolute: 0.5 10*3/uL (ref 0.1–1.0)
Monocytes Relative: 7 %
Neutro Abs: 5 10*3/uL (ref 1.7–7.7)
Neutrophils Relative %: 68 %
Platelets: 236 10*3/uL (ref 150–400)
RBC: 4.75 MIL/uL (ref 3.87–5.11)
RDW: 14.5 % (ref 11.5–15.5)
WBC: 7.4 10*3/uL (ref 4.0–10.5)
nRBC: 0 % (ref 0.0–0.2)

## 2020-05-13 NOTE — Progress Notes (Signed)
Hematology/Oncology Consult note [facility] Telephone:([phone] [phone]   Patient Care Team: Glean Hess, MD as PCP - General (Internal Medicine) Lequita Asal, MD as Referring Physician (Hematology and Oncology) Bary Castilla Forest Gleason, MD as Consulting Physician (General Surgery)  REFERRING PROVIDER: Nori Riis, PA-C  CHIEF COMPLAINTS/REASON FOR VISIT:  Evaluation of peritoneal carcinomatosis  HISTORY OF PRESENTING ILLNESS:   Morgan Schaefer is a  87 y.o.  female with PMH listed below was seen in consultation at the request of  Nori Riis, PA-C  for evaluation of peritoneal carcinomatosis.  Patient follows up with urology for urinary retention and she has suprapubic catheter exchanged periodically.  Patient was recently seen by and POA reported umbilical bleeding.  CT hematuria work-up was ordered. 05/09/2020, CT showed a large mass within the pelvis extending across midline, 6 cm.  This may be ovarian or uterine origin.  Suspicious for malignancy.  Is also left ovary enlargement measuring 5.1 x 2.0 x 3.5 cm There are signs of carcinomatosis with multiple peritoneal nodules in the abdomen and pelvis.  Partially loculated fluid noted within the posterior pelvis.  Enhancing nodule within the umbilicus and likely represents a metastatic lesion. Suspected nodal metastasis to the right CP angle.  Large hiatal hernia.  Small bilateral pleural effusion.  Mild increased caliber of the main pancreatic duct at the level of the head.  No obstructing stone or mass identified.  Aortic atherosclerosis.  She has dementia, and she was accompanied by her power of attorney [name] today to discuss CT results and management plan. She has a history of right breast cancer(no details available) s/p right mastectomy in 1980.   04/02/2020, patient was seen by [facility] oncology Dr. 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  Patient  underwent shave biopsy and then topical chemotherapy to local dermatologist.  Patient was seen by Dr. Mike Gip previously and was recommended to have mastectomy.  Her POA was concerned due to patient's age and dementia and switched care to [facility] oncology.  [facility] oncologist recommends aromatase inhibitor with serial imaging given the patient/power of attorney's preference.  Patient has an appointment with [facility] this month to be started on aromatase inhibitor.  Patient was referred by her neurologist to establish care with me for further evaluation pelvic mass and peritoneal carcinomatosis.  Power of attorney reports that patient has pelvic pressure, she feels the seatbelt being irritated lately.  Review of Systems  Unable to perform ROS: Dementia  Constitutional: Positive for fatigue.  HENT:   Positive for hearing loss.   Respiratory: Negative for cough, hemoptysis and shortness of breath.   Gastrointestinal: Positive for abdominal distention and constipation.  Genitourinary:        Pelvic pressure    MEDICAL HISTORY:  Past Medical History:  Diagnosis Date  . Asthma   . Hyperlipidemia   . Paget disease of breast, left ([place]) 10/2020  . Thyroid disease     SURGICAL HISTORY: Past Surgical History:  Procedure Laterality Date  . INTERSTIM IMPLANT PLACEMENT  05/2010  . INTRAOCULAR LENS IMPLANT, SECONDARY  2002  . MASTECTOMY Right 1980    SOCIAL HISTORY: Social History   Socioeconomic History  . Marital status: Widowed    Spouse name: Not on file  . Number of children: Not on file  . Years of education: Not on file  . Highest education level: Not on file  Occupational History  . Not on file  Tobacco Use  .  Smoking status: Never Smoker  . Smokeless tobacco: Never Used  Vaping Use  . Vaping Use: Never used  Substance and Sexual Activity  . Alcohol use: Not Currently  . Drug use: Not Currently  . Sexual activity: Not Currently  Other Topics Concern  . Not on file  Social  History Narrative  . Not on file   Social Determinants of Health   Financial Resource Strain: Not on file  Food Insecurity: Not on file  Transportation Needs: Not on file  Physical Activity: Not on file  Stress: Not on file  Social Connections: Not on file  Intimate Partner Violence: Not on file    FAMILY HISTORY: Family History  Problem Relation Age of Onset  . Heart disease Mother   . Heart disease Father     ALLERGIES:  is allergic to ciprofloxacin, penicillins, and sulfa antibiotics.  MEDICATIONS:  Current Outpatient Medications  Medication Sig Dispense Refill  . amLODipine (NORVASC) 5 MG tablet Take 5 mg by mouth daily.    Marland Kitchen aspirin EC 81 MG tablet Take 81 mg by mouth daily. Swallow whole.    . fluticasone-salmeterol (ADVAIR HFA) 115-21 MCG/ACT inhaler Inhale 2 puffs into the lungs 2 (two) times daily as needed.    Marland Kitchen guaiFENesin (MUCINEX) 600 MG 12 hr tablet Take 1 tablet by mouth 2 (two) times daily.    Marland Kitchen levothyroxine (SYNTHROID) 25 MCG tablet Take 1 tablet (25 mcg total) by mouth daily before breakfast. 90 tablet 1  . loratadine (CLARITIN) 10 MG tablet Take 10 mg by mouth daily.    . montelukast (SINGULAIR) 10 MG tablet Take 10 mg by mouth at bedtime.    . Multiple Vitamins-Minerals (MULTIVITAMINS THER. W/MINERALS) TABS tablet Take 1 tablet by mouth daily.    . Multiple Vitamins-Minerals (OCUVITE ADULT 50+ PO) Take 1 capsule by mouth daily.    . pantoprazole (PROTONIX) 40 MG tablet Take 1 tablet (40 mg total) by mouth daily. 90 tablet 1  . polyethylene glycol (MIRALAX / GLYCOLAX) 17 g packet Take 17 g by mouth 3 (three) times a week.    . saccharomyces boulardii (FLORASTOR) 250 MG capsule Take 250 mg by mouth daily. ( Probiotics OTC )    . simvastatin (ZOCOR) 20 MG tablet Take 20 mg by mouth daily.    Marland Kitchen Spacer/Aero-Holding Dorise Bullion Use with Advair HFA inhaler 1 each 1  . nitroGLYCERIN (NITROSTAT) 0.4 MG SL tablet Place 1 tablet (0.4 mg total) under the tongue  every 5 (five) minutes as needed for chest pain. (Patient not taking: Reported on 05/13/2020) 25 tablet 0   No current facility-administered medications for this visit.     PHYSICAL EXAMINATION: ECOG PERFORMANCE STATUS: 2 - Symptomatic, <50% confined to bed Vitals:   05/13/20 1413  BP: (!) 127/50  Pulse: (!) 48  Resp: 18  Temp: 99.3 F (37.4 C)   Filed Weights   05/13/20 1413  Weight: 138 lb 1.6 oz (62.6 kg)    Physical Exam Constitutional:      General: She is not in acute distress.    Comments: Patient sits in the wheelchair  HENT:     Head: Normocephalic and atraumatic.  Eyes:     General: No scleral icterus. Cardiovascular:     Rate and Rhythm: Normal rate and regular rhythm.     Heart sounds: Normal heart sounds.  Pulmonary:     Effort: Pulmonary effort is normal. No respiratory distress.     Breath sounds: No wheezing.  Abdominal:  General: Bowel sounds are normal.     Palpations: Abdomen is soft. There is mass.  Musculoskeletal:        General: No deformity. Normal range of motion.     Cervical back: Normal range of motion and neck supple.  Skin:    General: Skin is warm and dry.     Findings: No erythema or rash.  Neurological:     Mental Status: She is alert. Mental status is at baseline.     Cranial Nerves: No cranial nerve deficit.     Coordination: Coordination normal.     Comments: Hearing loss  Psychiatric:        Mood and Affect: Mood normal.     LABORATORY DATA:  I have reviewed the data as listed Lab Results  Component Value Date   WBC 7.4 05/13/2020   HGB 13.9 05/13/2020   HCT 42.9 05/13/2020   MCV 90.3 05/13/2020   PLT 236 05/13/2020   Recent Labs    08/01/19 1502 12/11/19 1547 04/09/20 1616 05/13/20 1542  NA 140 139 142 140  K 4.7 4.5 5.2 4.7  CL 102 105 105 106  CO2 24 25 21 22   GLUCOSE 89 92 96 98  BUN 19 32* 29 33*  CREATININE 1.03* 1.00 0.89 0.74  CALCIUM 10.0 9.6 9.9 9.4  GFRNONAA 48* 53*  --  >60  GFRAA 55*  --    --   --   PROT 6.9 7.3 6.6 7.4  ALBUMIN 4.5 4.1 4.1 3.9  AST 14 19 17 18   ALT 10 13 7 10   ALKPHOS 83 72 95 73  BILITOT 0.3 0.6 0.2 0.4   Iron/TIBC/Ferritin/ %Sat No results found for: IRON, TIBC, FERRITIN, IRONPCTSAT    RADIOGRAPHIC STUDIES: I have personally reviewed the radiological images as listed and agreed with the findings in the report. CT HEMATURIA WORKUP  Result Date: 05/09/2020 CLINICAL DATA:  Bleeding from umbilicus. EXAM: CT ABDOMEN AND PELVIS WITHOUT AND WITH CONTRAST TECHNIQUE: Multidetector CT imaging of the abdomen and pelvis was performed following the standard protocol before and following the bolus administration of intravenous contrast. CONTRAST:  123mL OMNIPAQUE IOHEXOL 300 MG/ML  SOLN COMPARISON:  None. FINDINGS: Lower chest: Small bilateral pleural effusions. There is a large hiatal hernia. Soft tissue nodule versus lymph node identified within the right CP angle fat measuring 2.9 x 1.2 cm. Hepatobiliary: No focal liver abnormality. Gallbladder is unremarkable. No biliary ductal dilatation. Pancreas: No pancreatic inflammation or mass identified. Mild increase caliber of the main pancreatic duct at the level of the head measures up to 4.7 mm, image 33/4. Spleen: Normal size spleen. There is a thin rind of low-attenuation soft tissue within the splenic hilum with multiple centrally located low-attenuation lesions measuring up to 1.7 cm, image 14/4. Adrenals/Urinary Tract: Normal appearance of the adrenal glands. No kidney mass or hydronephrosis identified. The urinary bladder is collapsed around a suprapubic Foley catheter. No focal bladder abnormality. Stomach/Bowel: Large hiatal hernia. The appendix is not confidently identified separate from the right lower quadrant bowel loops. Extensive distal colonic diverticulosis identified without signs of acute diverticulitis. No pathologic dilatation of the large or small bowel loops. Vascular/Lymphatic: Aortic atherosclerosis  without aneurysm. Small retroperitoneal and mesenteric lymph nodes are identified. No abdominopelvic adenopathy. Reproductive: The uterus is either surgically absent or atrophic. Within the right adnexa there is a large mass which extends across the midline of the pelvis. This measures 9.1 x 4.7 by 5.6 cm. The left ovary contains several cysts  and measures 5.1 x 2.0 by 3.5 cm. Other: Signs of peritoneal carcinomatosis identified. Multiple soft tissue nodules are noted within the abdomen and pelvis, including: -lesion in the left upper quadrant of the abdomen measuring 2.8 x 2.1 cm, image 17/4. -peritoneal nodule in the left posterior pelvis measures 1.1 x 1.0 cm, image 57/4. -Peritoneal nodule within the ventral abdomen along the undersurface of the abdominal wall measures 0.9 by 0.7 cm Partially loculated ascites identified within the posterior pelvis. There is a enhancing soft tissue nodule within the umbilicus which measures 1.7 x 1.1 cm, image 65/9. Musculoskeletal: Remote healed left pubic bone fracture. Multilevel degenerative disc disease noted. 6 mm anterolisthesis of L5 on S1 noted. IMPRESSION: 1. There is a large mass within the right side of pelvis extending across the midline. This may be of ovarian or uterine origin. Suspicious for malignancy. 2. Signs of peritoneal carcinomatosis with multiple peritoneal nodules none in the abdomen and pelvis. Partially loculated fluid noted within the posterior pelvis. 3. Enhancing nodule within the umbilicus is identified and likely represents a metastatic lesion. 4. Suspect nodal metastasis to the right CP angle. 5. Large hiatal hernia. 6. Small bilateral pleural effusions. 7. Mild increase caliber of the main pancreatic duct at the level of the head. No obstructing stone or mass identified. 8. Aortic atherosclerosis. Aortic Atherosclerosis (ICD10-I70.0). These results were called by telephone at the time of interpretation on 05/09/2020 at 12:40 pm to provider  [facility] , who verbally acknowledged these results. Electronically Signed   By: Kerby Moors M.D.   On: 05/09/2020 12:41      ASSESSMENT & PLAN:  1. Peritoneal carcinomatosis ([place])   2. Paget's carcinoma of the nipple, left ([place])   3. Pelvic mass    #Pelvic mass with radiographic evidence of peritoneal carcinomatosis. Recommend check CBC CMP, CEA CA125. Suspect malignancy with GYN origin. Discussed with both patient and power of attorney about the standard of care which will be biopsy to establish tissue diagnosis.  If patient/power of attorney are interested in the establish the diagnosis, I recommend IR CT-guided biopsy of the peritoneal nodules.  Most likely need neoadjuvant chemotherapy followed by debulking surgery if GYN origin malignancy is confirmed.  She has a history of Paget disease, recent mammogram 12/29/2019 revealed an indeterminate 8 mm focal asymmetry with calcifications.  Patient declined surgery there is plan for her to start on aromatase inhibitor series imaging.  She may have DCIS/invasive breast cancer.  Small chance of pelvic mass being part of the metastasis from breast origin.  Goals of care discussion.  Power of attorney would like to further discussed with patient and update [place] regarding what patient physician is.  Most likely they are interested in proceeding with palliative care service evaluation, symptom control.  Refer to palliative care service for a virtual visit later this week for further discussion.   Orders Placed This Encounter  Procedures  . Comprehensive metabolic panel    Standing Status:   Future    Number of Occurrences:   1    Standing Expiration Date:   05/13/2021  . CBC with Differential/Platelet    Standing Status:   Future    Number of Occurrences:   1    Standing Expiration Date:   05/13/2021  . CEA    Standing Status:   Future    Number of Occurrences:   1    Standing Expiration Date:   05/13/2021  . CA 125    Standing Status:  Future    Number of Occurrences:   1    Standing Expiration Date:   05/13/2021  . Ambulatory Referral to Palliative Care    Referral Priority:   Routine    Referral Type:   Consultation    Referral Reason:   Advance Care Planning    Number of Visits Requested:   1    All questions were answered. The patient knows to call the clinic with any problems questions or concerns.  cc McGowan, Hunt Oris, PA-C    Thank you for this kind referral and the opportunity to participate in the care of this patient. A copy of today's note is routed to referring provider    Earlie Server, MD, PhD Hematology Oncology [facility] at [facility] Pager- [phone] 05/13/2020

## 2020-05-13 NOTE — Progress Notes (Signed)
Urgent referral placed to [place] [facility] for peritoneal carcinomatosis.

## 2020-05-13 NOTE — Progress Notes (Signed)
Patient here to establish care for peritoneal carcinomatosis.

## 2020-05-14 LAB — CEA: CEA: 2.6 ng/mL (ref 0.0–4.7)

## 2020-05-14 LAB — CA 125: Cancer Antigen (CA) 125: 313 U/mL — ABNORMAL HIGH (ref 0.0–38.1)

## 2020-05-16 ENCOUNTER — Inpatient Hospital Stay (HOSPITAL_BASED_OUTPATIENT_CLINIC_OR_DEPARTMENT_OTHER): Payer: Medicare Other | Admitting: Hospice and Palliative Medicine

## 2020-05-16 DIAGNOSIS — Z515 Encounter for palliative care: Secondary | ICD-10-CM

## 2020-05-16 DIAGNOSIS — C786 Secondary malignant neoplasm of retroperitoneum and peritoneum: Secondary | ICD-10-CM

## 2020-05-16 NOTE — Progress Notes (Signed)
Virtual Visit via Video Note  I connected with Morgan Schaefer on 05/16/20 at  1:45 PM EDT by a video enabled telemedicine application and verified that I am speaking with the correct person using two identifiers.  Location: Patient: home Provider: clinic   I discussed the limitations of evaluation and management by telemedicine and the availability of in person appointments. The patient expressed understanding and agreed to proceed.  History of Present Illness: [name] is a 87 year old woman with multiple medical problems including peritoneal carcinomatosis, urinary retention s/p SP catheter, and mild dementia.  Work-up for hematuria including an abdominal CT revealed a large pelvic mass, which could be ovarian or uterine in origin.  There is also significant carcinomatosis noted.  Patient was seen in consultation by medical oncology but patient and caregiver ultimately deferred further work-up and treatment.  She was referred to palliative care to help address goals.   Observations/Objective: I spoke with patient and her healthcare power of attorney/caregiver, Morgan Schaefer.  Patient has decided not to pursue work-up or treatment for presumed cancer.  Instead, she wants to stay at home with Morgan Schaefer and focus on comfort.  We discussed the option for hospice involvement in detail.  Patient and Morgan Schaefer were in agreement with hospice care.  We will send referral.  Symptomatically, she is doing reasonably okay without significant distressing symptoms at present.  We did discuss some possible symptomatic changes to expect as she declines.  Patient will benefit from increased support in the home through hospice.  Patient is a DNR/DNI.  They have a [place] DNR order in the home.  We will sign a new DNR order and mail it to patient's house.  Assessment and Plan: Peritoneal carcinomatosis -plan for comfort care at home.  We will send hospice referral. DNR/DNI  Follow Up Instructions: As  needed   I discussed the assessment and treatment plan with the patient. The patient was provided an opportunity to ask questions and all were answered. The patient agreed with the plan and demonstrated an understanding of the instructions.   The patient was advised to call back or seek an in-person evaluation if the symptoms worsen or if the condition fails to improve as anticipated.  I provided 30 minutes of non-face-to-face time during this encounter.   Irean Hong, NP

## 2020-05-20 ENCOUNTER — Telehealth: Payer: Self-pay

## 2020-05-20 ENCOUNTER — Other Ambulatory Visit: Payer: Self-pay | Admitting: Internal Medicine

## 2020-05-20 MED ORDER — MONTELUKAST SODIUM 10 MG PO TABS: 10.0000 mg | ORAL_TABLET | Freq: Every day | ORAL | 1 refills | Status: AC

## 2020-05-20 NOTE — Telephone Encounter (Signed)
Requested medication (s) are due for refill today: yes  Requested medication (s) are on the active medication list: yes  Last refill: ?  Future visit scheduled: no  Notes to clinic:  historical provider    Requested Prescriptions  Pending Prescriptions Disp Refills   montelukast (SINGULAIR) 10 MG tablet      Sig: Take 1 tablet (10 mg total) by mouth at bedtime.      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 Glean Hess, MD       Future Appointments             In 1 week McGowan, Gordan Payment [place]

## 2020-05-20 NOTE — Telephone Encounter (Signed)
I am aware of the situation and agree with hospice referral.

## 2020-05-20 NOTE — Telephone Encounter (Signed)
Medication Refill - Medication: Montelukast  Has the patient contacted their pharmacy? Yes.   POA states that she contacted pharmacy and that they have no refills at pharmacy. She states that the pt is no longer seeing provider who originally prescribed medication.  (Agent: If no, request that the patient contact the pharmacy for the refill.) (Agent: If yes, when and what did the pharmacy advise?)  Preferred Pharmacy (with phone number or street name):  [place], [address]  [place] [address]  Phone: [phone] Fax: [phone]  Hours: Not open 24 hours     Agent: Please be advised that RX refills may take up to 3 business days. We ask that you follow-up with your pharmacy.

## 2020-05-20 NOTE — Telephone Encounter (Signed)
Please review.  KP

## 2020-05-20 NOTE — Telephone Encounter (Unsigned)
Copied from [place] [id number]. Topic: General - Other >> May 20, 2020 10:40 AM Celene Kras wrote: Reason for CRM: POA callign stating that the pt was diagnosed on 05/13/20 with stage 4 ovarian cancer. She states that the pt has a hospice consult on 05/22/20. Please advise.

## 2020-05-23 ENCOUNTER — Telehealth: Payer: Self-pay | Admitting: Hospice and Palliative Medicine

## 2020-05-23 NOTE — Telephone Encounter (Signed)
I spoke with hospice nurse. Hospice does not cover current inhalers. Okay to start duonebs.

## 2020-05-26 NOTE — Progress Notes (Signed)
Suprapubic Cath Change  Patient is present today for a suprapubic catheter change due to urinary retention.  9 ml of water was drained from the balloon, a 18 FR silicone foley cath was removed from the tract with out difficulty.  Site was cleaned and prepped in a sterile fashion with betadine.  A 18 FR council tip foley cath was replaced into the tract no complications were noted. Urine return was noted, 10 ml of sterile water was inflated into the balloon and a leg bag was attached for drainage.  Patient tolerated well.   Performed by: Zara Council, PA-C   Follow up: three weeks for SPT exchange

## 2020-05-27 ENCOUNTER — Other Ambulatory Visit: Payer: Self-pay

## 2020-05-27 ENCOUNTER — Ambulatory Visit (INDEPENDENT_AMBULATORY_CARE_PROVIDER_SITE_OTHER): Payer: Medicare Other | Admitting: Urology

## 2020-05-27 DIAGNOSIS — Z9359 Other cystostomy status: Secondary | ICD-10-CM

## 2020-05-27 DIAGNOSIS — R339 Retention of urine, unspecified: Secondary | ICD-10-CM | POA: Diagnosis not present

## 2020-05-28 ENCOUNTER — Telehealth: Payer: Self-pay

## 2020-05-28 NOTE — Telephone Encounter (Signed)
At visit yesterday patient's daughter informed us that patient would be entering [place] and would no longer be able to come to clinic for tube exchanges. She was able to provide the contact information for hospice nurse: [place] and speak with Triage for verbal orders: [phone]. Spoke w/Davanea Bush, RN and gave verbal order per Zara Council, [facility] for a monthly Suprapubic tube exchange 18 FR with leg bag attachment for drainage. PRN irrigation w/saline if clogged. Contact information given for our office if problems arise or additional orders needed. Verbal read back of orders was provided by Davanea.   Patient's daughter Vinnie Level was notified that Hospice would be taking over patient's catheter exchanges. Patient's follow up in June was cancelled

## 2020-06-24 ENCOUNTER — Ambulatory Visit: Payer: Medicare Other | Admitting: Urology

## 2020-07-23 ENCOUNTER — Other Ambulatory Visit: Payer: Self-pay | Admitting: Internal Medicine

## 2020-08-02 ENCOUNTER — Telehealth: Payer: Self-pay | Admitting: *Deleted

## 2020-08-02 ENCOUNTER — Other Ambulatory Visit: Payer: Self-pay | Admitting: Hospice and Palliative Medicine

## 2020-08-02 MED ORDER — OXYCODONE HCL 5 MG PO TABS: 5.0000 mg | ORAL_TABLET | ORAL | 0 refills | Status: AC | PRN

## 2020-08-02 NOTE — Telephone Encounter (Signed)
Call from [facility]-this is the hospice pt that has ovarian cancer, has open  woud at belly button and changed the dressing  frequently and you can see masses up from skin. she has been taking tylenol and now is requesting to get  pain med. rating pain 5, call Mitzi at [facility]. Sonia Baller will take care of calling her back with meds

## 2020-08-02 NOTE — Progress Notes (Signed)
Spoke with patient's hospice nurse.  They requested something stronger than acetaminophen for pain.  Will send Rx for oxycodone.

## 2020-08-02 NOTE — Telephone Encounter (Signed)
Later in the day enny spke to [facility] and he called hospice and sent oxycodone for pain

## 2020-08-05 ENCOUNTER — Other Ambulatory Visit: Payer: Self-pay | Admitting: Internal Medicine

## 2020-09-17 ENCOUNTER — Other Ambulatory Visit: Payer: Self-pay | Admitting: Hospice and Palliative Medicine

## 2020-09-17 ENCOUNTER — Telehealth: Payer: Self-pay | Admitting: Hospice and Palliative Medicine

## 2020-09-17 MED ORDER — QUETIAPINE FUMARATE 25 MG PO TABS: 12.5000 mg | ORAL_TABLET | Freq: Every evening | ORAL | 2 refills | Status: AC | PRN

## 2020-09-17 NOTE — Telephone Encounter (Signed)
Received a call from patient's hospice nurse.  Patient is having occasional hallucinations and difficulty sleeping.  No fever or chills.  No urinary symptoms.  Okay to initiate Seroquel 12 and half milligrams nightly off protocol.

## 2020-10-12 DEATH — deceased

## 2021-12-25 IMAGING — US US BREAST*L* LIMITED INC AXILLA
1 series · 6 of 6 positions shown · non-contrast
Comparison: None.
COMPARISON: None.

Addendum:
CLINICAL DATA: [AGE] female with a long remote history of
right breast cancer status post mastectomy. The patient was seen by
dermatology for changes to her left nipple. She had a biopsy
performed which demonstrated mammary Paget's disease on 11/07/2019.
She has not had recent breast imaging. Her most recent prior exams
were in [place] in about 2066 or 8826. These images will be
requested, but are not available at the time of this study. Of note,
the patient has Alzheimer's disease.

EXAM:
DIGITAL DIAGNOSTIC UNILATERAL LEFT MAMMOGRAM WITH TOMO AND CAD;
ULTRASOUND LEFT BREAST LIMITED

[Series 1: us breast*left* limited inc axilla · 0.05mm/px · 6 of 6 slices shown]
[im 1/6]
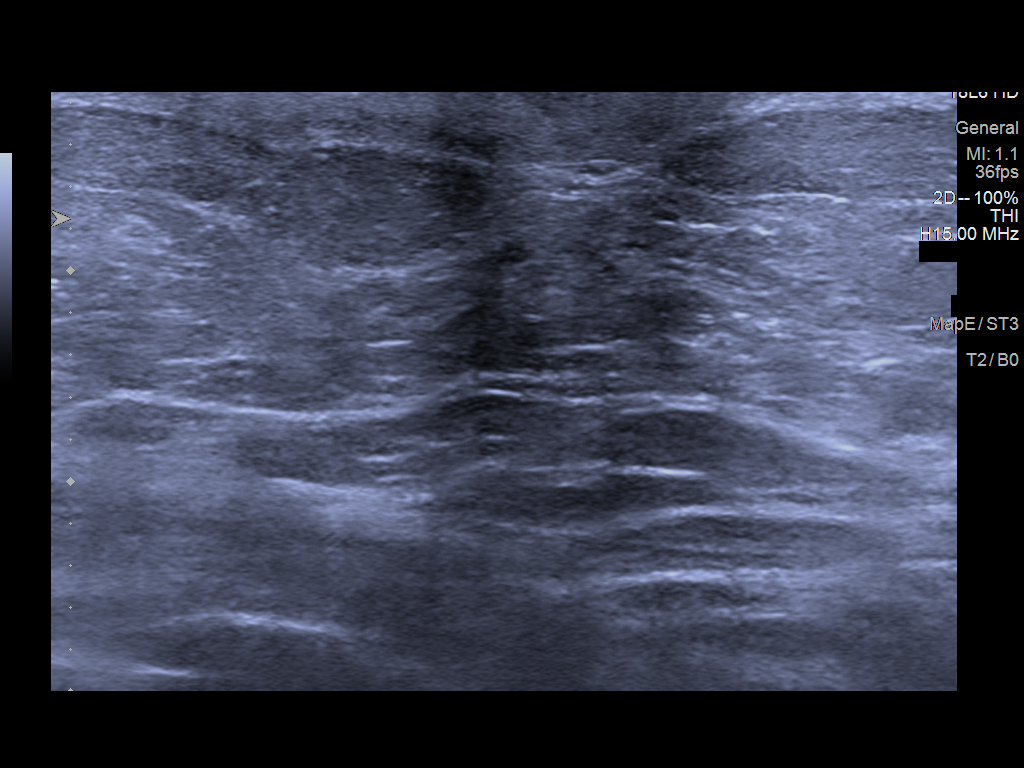
[im 2/6]
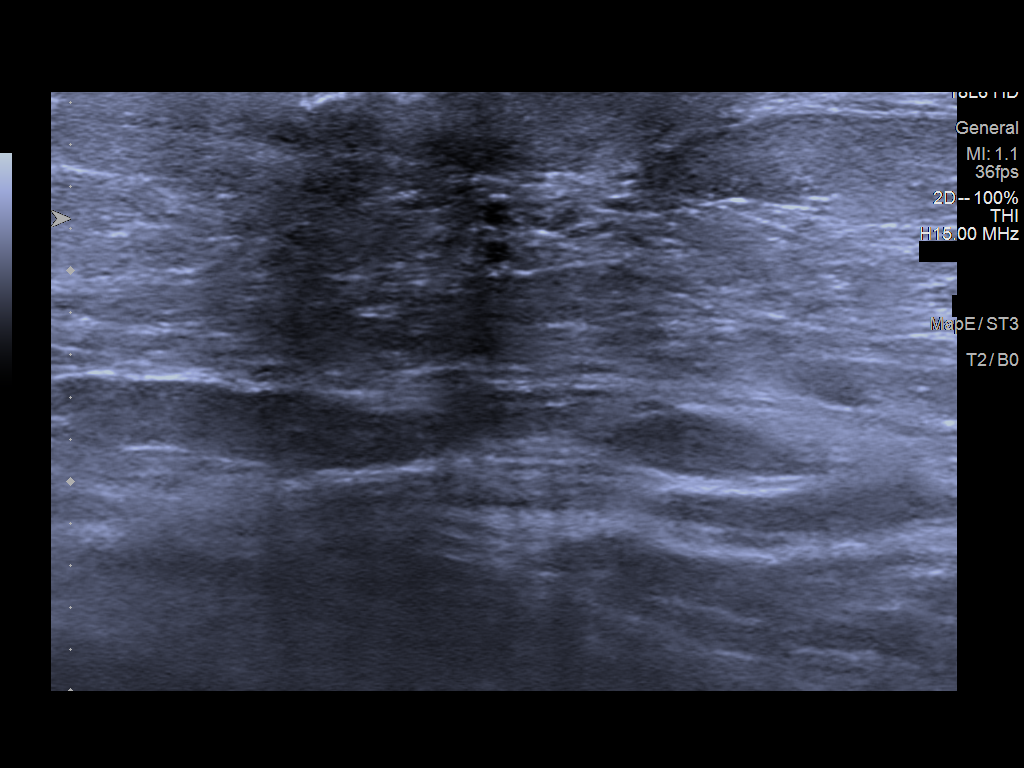
[im 3/6]
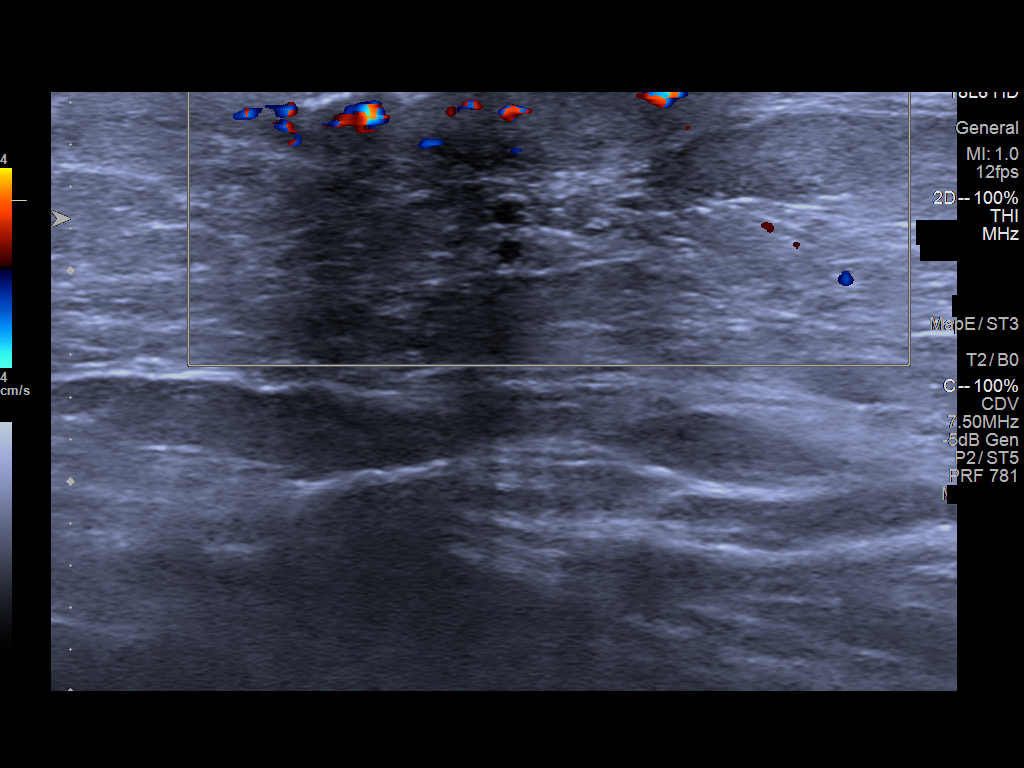
[im 4/6]
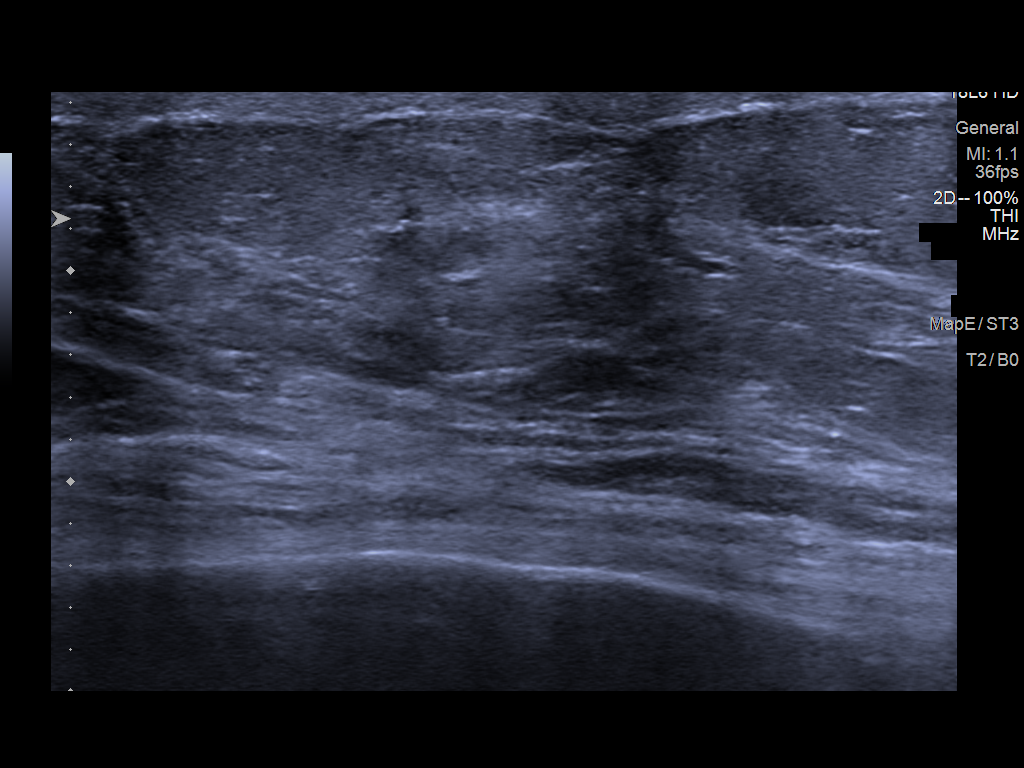
[im 5/6]
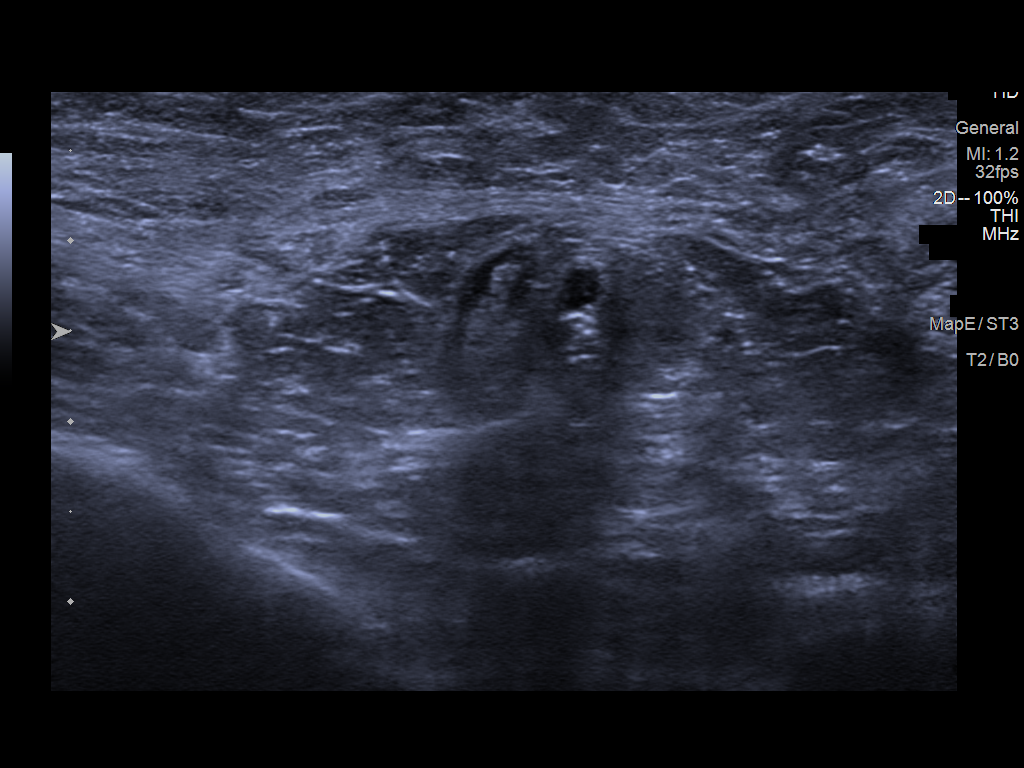
[im 6/6]
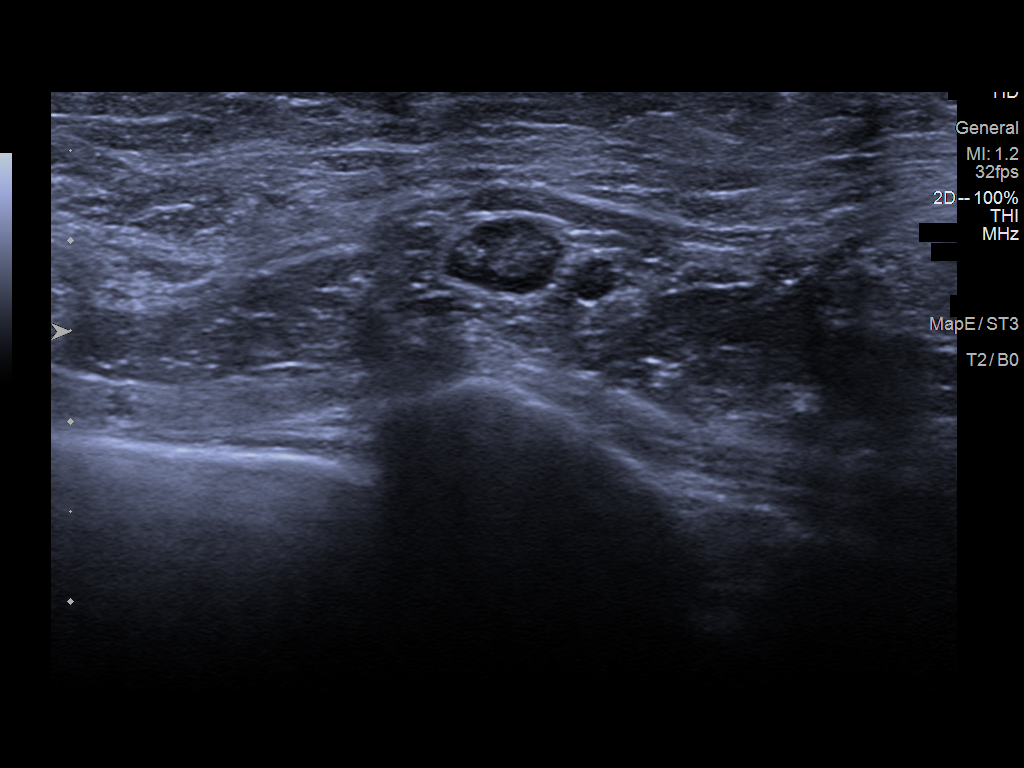

[6 of 6 positions shown; findings below may reference images not displayed]

ACR Breast Density Category b: There are scattered areas of
fibroglandular density.
FINDINGS: In the central anterior left breast, there is a focal asymmetry with
associated coarse heterogeneous calcifications measuring
approximately 8 mm. The calcifications appear benign, and therefore
the asymmetry may represent an underlying fibroadenoma. No other
suspicious calcifications, masses or areas of distortion are seen in
the left breast.

Mammographic images were processed with CAD.

Ultrasound of the superior and retroareolar left breast demonstrate
no suspicious masses or areas of shadowing. Ultrasound targeted to
the left axilla demonstrates multiple small benign appearing lymph
nodes.
IMPRESSION: 1. There is an indeterminate subcentimeter focal asymmetry in the
central anterior left breast without a sonographic correlate.

2.  No evidence of left axillary lymphadenopathy.

RECOMMENDATION:
1. We will attempt to acquire the patient's remote prior mammograms
to compare to the current study. If the asymmetry with
calcifications in the central anterior breast has been stable for
over 2 years, no further management for this would be recommended.
If not present previously, or if it has grown, stereotactic biopsy
would then be recommended.

2. Ideally, breast MRI would be performed to further exclude the
possibility of malignancy within the breast tissue, as it is a
higher sensitivity tool. However, given the patient's age and
history of Alzheimer's Disease, decision regarding the patient's
ability to tolerate the MRI and determination of the clinical
benefit in this case will be made by her clinical team.

I have discussed the findings and recommendations with the patient.
If applicable, a reminder letter will be sent to the patient
regarding the next appointment.

BI-RADS CATEGORY  4: Suspicious.

ADDENDUM:
Prior mammograms from 11/02/2013 and 11/01/2012 have become
available for comparison. The asymmetry seen in the central anterior
left breast has been stable since 2066. The scant calcifications
associated with the asymmetry have developed over time (one
calcification was seen on the 2066 exam) but are coarse and
heterogeneous as expected with a benign involuting fibroadenoma.
Overall, this finding has a benign appearance, and therefore biopsy
is not necessary. Decision regarding MRI should be made with the
clinician and the patient with her family.

Amended BI-RADS: 2: Benign.

*** End of Addendum ***
ACR Breast Density Category b: There are scattered areas of
fibroglandular density.
FINDINGS: In the central anterior left breast, there is a focal asymmetry with
associated coarse heterogeneous calcifications measuring
approximately 8 mm. The calcifications appear benign, and therefore
the asymmetry may represent an underlying fibroadenoma. No other
suspicious calcifications, masses or areas of distortion are seen in
the left breast.

Mammographic images were processed with CAD.

Ultrasound of the superior and retroareolar left breast demonstrate
no suspicious masses or areas of shadowing. Ultrasound targeted to
the left axilla demonstrates multiple small benign appearing lymph
nodes.
IMPRESSION: 1. There is an indeterminate subcentimeter focal asymmetry in the
central anterior left breast without a sonographic correlate.

2.  No evidence of left axillary lymphadenopathy.

RECOMMENDATION:
1. We will attempt to acquire the patient's remote prior mammograms
to compare to the current study. If the asymmetry with
calcifications in the central anterior breast has been stable for
over 2 years, no further management for this would be recommended.
If not present previously, or if it has grown, stereotactic biopsy
would then be recommended.

2. Ideally, breast MRI would be performed to further exclude the
possibility of malignancy within the breast tissue, as it is a
higher sensitivity tool. However, given the patient's age and
history of Alzheimer's Disease, decision regarding the patient's
ability to tolerate the MRI and determination of the clinical
benefit in this case will be made by her clinical team.

I have discussed the findings and recommendations with the patient.
If applicable, a reminder letter will be sent to the patient
regarding the next appointment.

BI-RADS CATEGORY  4: Suspicious.

## 2021-12-25 IMAGING — MG MM DIGITAL DIAGNOSTIC UNILAT*L* W/ TOMO W/ CAD
5 of 10 series · 5 of 30 positions shown · non-contrast
Comparison: None.
COMPARISON: None.

Addendum:
CLINICAL DATA: [AGE] female with a long remote history of
right breast cancer status post mastectomy. The patient was seen by
dermatology for changes to her left nipple. She had a biopsy
performed which demonstrated mammary Paget's disease on 11/07/2019.
She has not had recent breast imaging. Her most recent prior exams
were in [place] in about 2066 or 8826. These images will be
requested, but are not available at the time of this study. Of note,
the patient has Alzheimer's disease.

EXAM:
DIGITAL DIAGNOSTIC UNILATERAL LEFT MAMMOGRAM WITH TOMO AND CAD;
ULTRASOUND LEFT BREAST LIMITED

[L MLO synth-2D (1 of 3)]
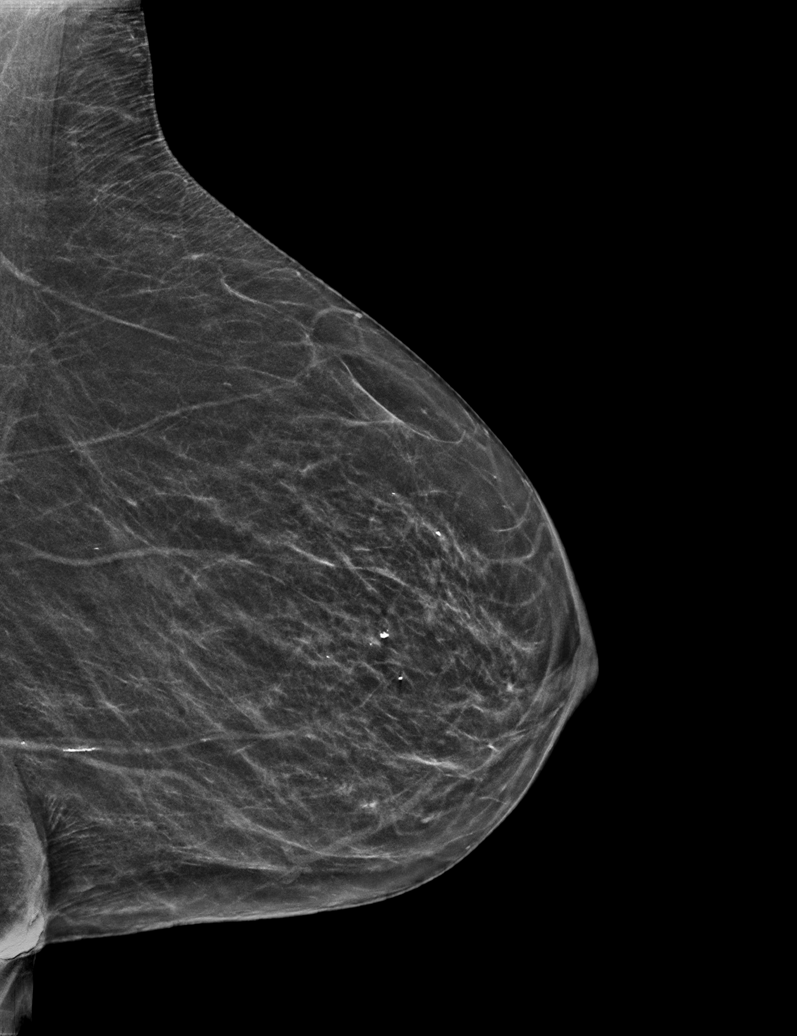

[L CC synth-2D (1 of 2)]
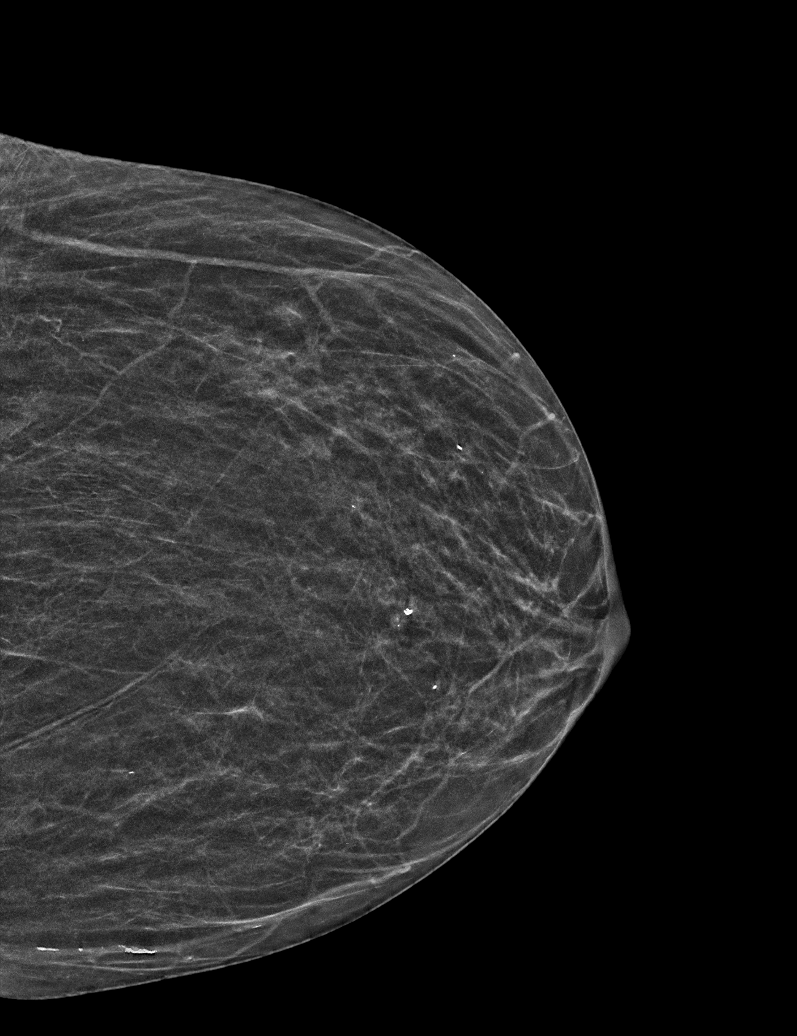

[L MLO synth-2D (2 of 3)]
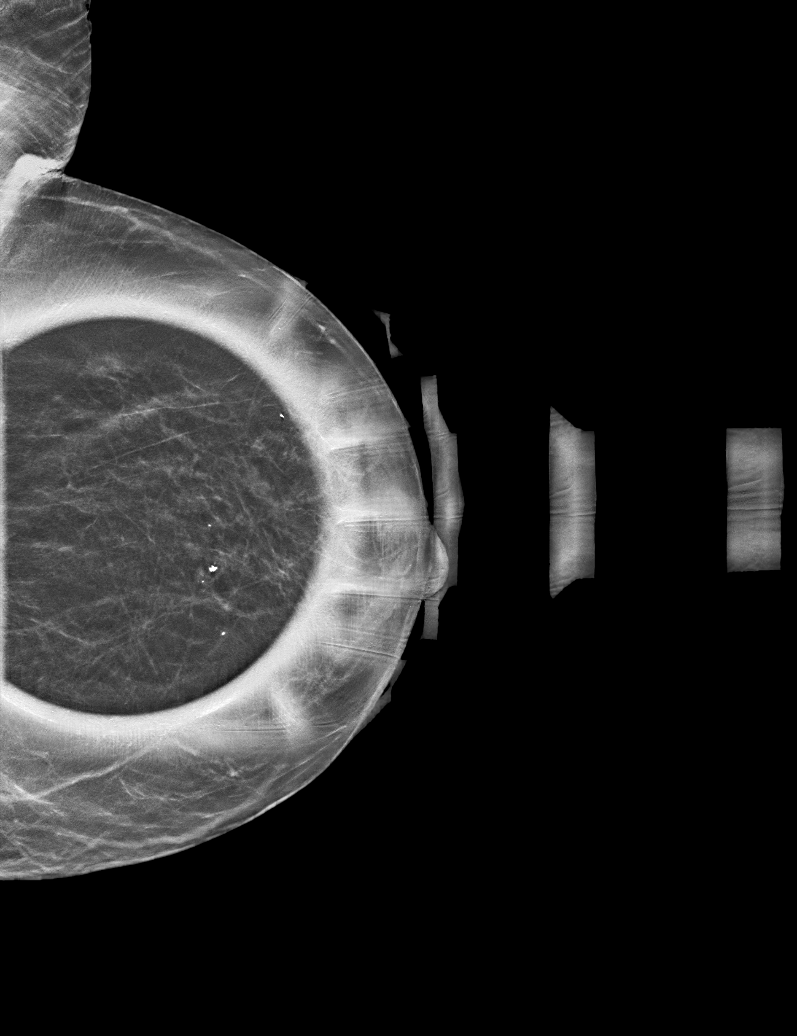

[L MLO synth-2D (3 of 3)]
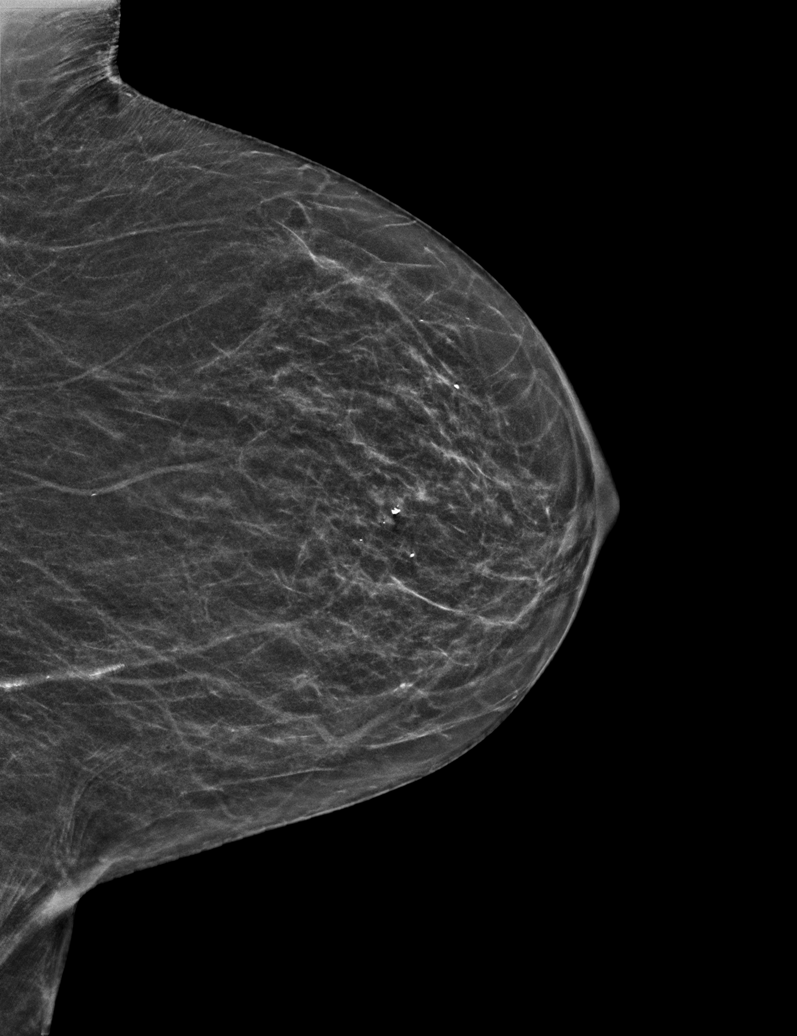

[L CC synth-2D (2 of 2)]
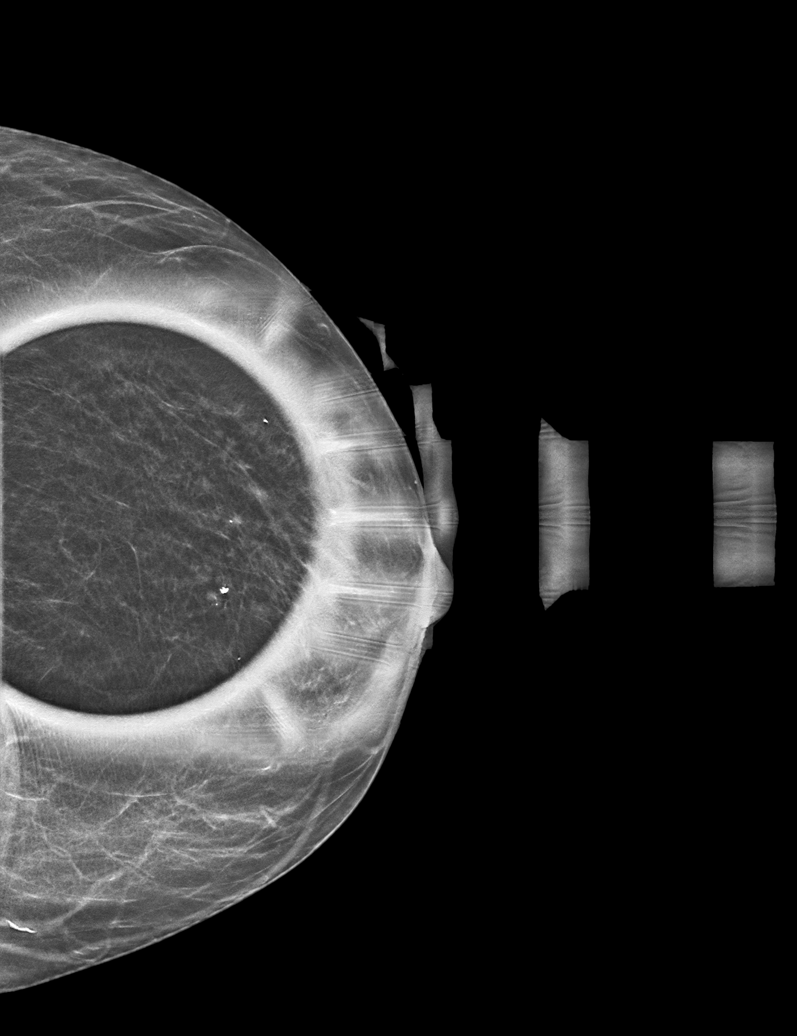

[5 of 30 positions shown; findings below may reference images not displayed]

ACR Breast Density Category b: There are scattered areas of
fibroglandular density.
FINDINGS: In the central anterior left breast, there is a focal asymmetry with
associated coarse heterogeneous calcifications measuring
approximately 8 mm. The calcifications appear benign, and therefore
the asymmetry may represent an underlying fibroadenoma. No other
suspicious calcifications, masses or areas of distortion are seen in
the left breast.

Mammographic images were processed with CAD.

Ultrasound of the superior and retroareolar left breast demonstrate
no suspicious masses or areas of shadowing. Ultrasound targeted to
the left axilla demonstrates multiple small benign appearing lymph
nodes.
IMPRESSION: 1. There is an indeterminate subcentimeter focal asymmetry in the
central anterior left breast without a sonographic correlate.

2.  No evidence of left axillary lymphadenopathy.

RECOMMENDATION:
1. We will attempt to acquire the patient's remote prior mammograms
to compare to the current study. If the asymmetry with
calcifications in the central anterior breast has been stable for
over 2 years, no further management for this would be recommended.
If not present previously, or if it has grown, stereotactic biopsy
would then be recommended.

2. Ideally, breast MRI would be performed to further exclude the
possibility of malignancy within the breast tissue, as it is a
higher sensitivity tool. However, given the patient's age and
history of Alzheimer's Disease, decision regarding the patient's
ability to tolerate the MRI and determination of the clinical
benefit in this case will be made by her clinical team.

I have discussed the findings and recommendations with the patient.
If applicable, a reminder letter will be sent to the patient
regarding the next appointment.

BI-RADS CATEGORY  4: Suspicious.

ADDENDUM:
Prior mammograms from 11/02/2013 and 11/01/2012 have become
available for comparison. The asymmetry seen in the central anterior
left breast has been stable since 2066. The scant calcifications
associated with the asymmetry have developed over time (one
calcification was seen on the 2066 exam) but are coarse and
heterogeneous as expected with a benign involuting fibroadenoma.
Overall, this finding has a benign appearance, and therefore biopsy
is not necessary. Decision regarding MRI should be made with the
clinician and the patient with her family.

Amended BI-RADS: 2: Benign.

*** End of Addendum ***
ACR Breast Density Category b: There are scattered areas of
fibroglandular density.
FINDINGS: In the central anterior left breast, there is a focal asymmetry with
associated coarse heterogeneous calcifications measuring
approximately 8 mm. The calcifications appear benign, and therefore
the asymmetry may represent an underlying fibroadenoma. No other
suspicious calcifications, masses or areas of distortion are seen in
the left breast.

Mammographic images were processed with CAD.

Ultrasound of the superior and retroareolar left breast demonstrate
no suspicious masses or areas of shadowing. Ultrasound targeted to
the left axilla demonstrates multiple small benign appearing lymph
nodes.
IMPRESSION: 1. There is an indeterminate subcentimeter focal asymmetry in the
central anterior left breast without a sonographic correlate.

2.  No evidence of left axillary lymphadenopathy.

RECOMMENDATION:
1. We will attempt to acquire the patient's remote prior mammograms
to compare to the current study. If the asymmetry with
calcifications in the central anterior breast has been stable for
over 2 years, no further management for this would be recommended.
If not present previously, or if it has grown, stereotactic biopsy
would then be recommended.

2. Ideally, breast MRI would be performed to further exclude the
possibility of malignancy within the breast tissue, as it is a
higher sensitivity tool. However, given the patient's age and
history of Alzheimer's Disease, decision regarding the patient's
ability to tolerate the MRI and determination of the clinical
benefit in this case will be made by her clinical team.

I have discussed the findings and recommendations with the patient.
If applicable, a reminder letter will be sent to the patient
regarding the next appointment.

BI-RADS CATEGORY  4: Suspicious.

## 2022-05-06 IMAGING — CT CT ABD-PEL WO/W CM
2 of 6 series · 12 of 32 positions shown, 17 images · IV contrast (omnipaque)
Comparison: None.

CLINICAL DATA: Bleeding from umbilicus.

EXAM:
CT ABDOMEN AND PELVIS WITHOUT AND WITH CONTRAST
TECHNIQUE: Multidetector CT imaging of the abdomen and pelvis was performed
following the standard protocol before and following the bolus
administration of intravenous contrast.
CONTRAST:  125mL OMNIPAQUE IOHEXOL 300 MG/ML  SOLN

[Series 2: axial pre · axial · non-contrast · 0.64mm/px · z∈[-963,-653]mm · 7 of 84 slices shown, 12 images]
[im 11/84  soft-tissue]
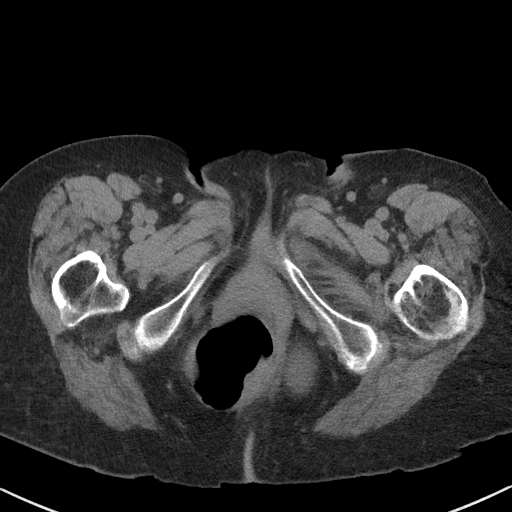
[im 11/84  bone]
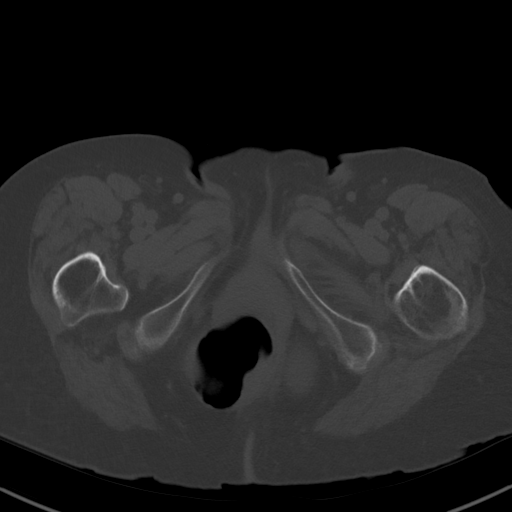
[im 21/84  soft-tissue]
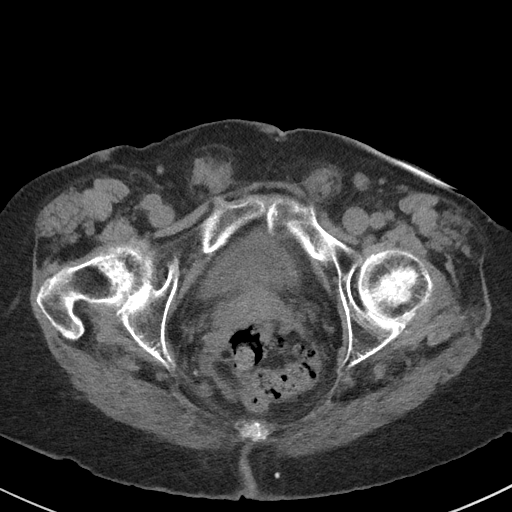
[im 32/84  soft-tissue]
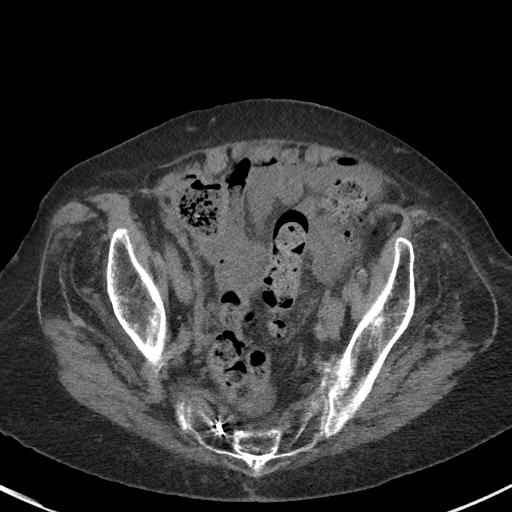
[im 42/84  soft-tissue]
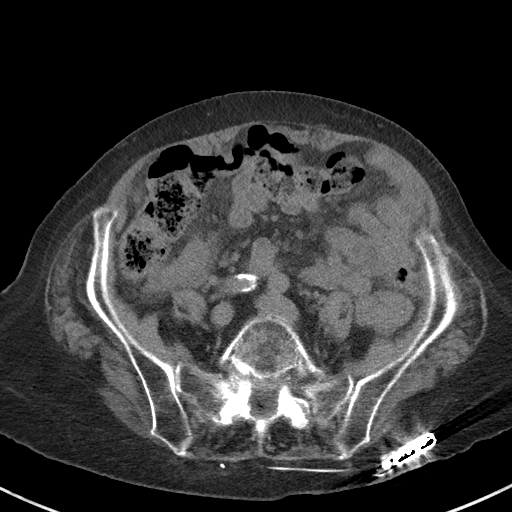
[im 42/84  lung]
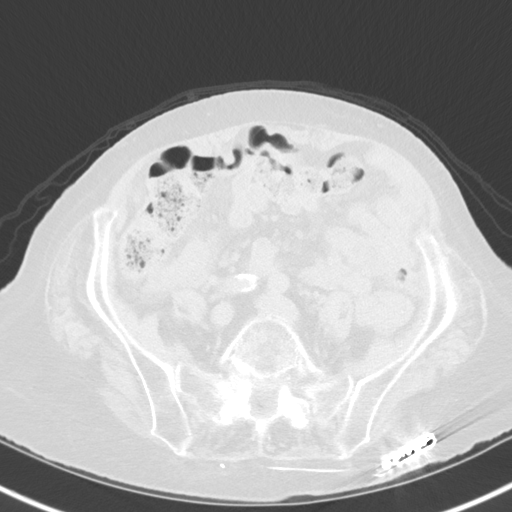
[im 52/84  soft-tissue]
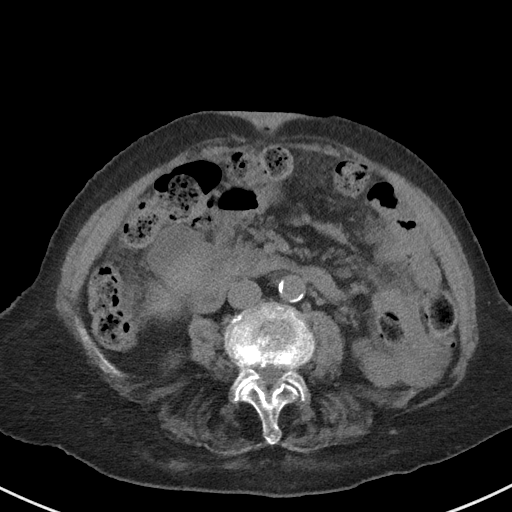
[im 52/84  lung]
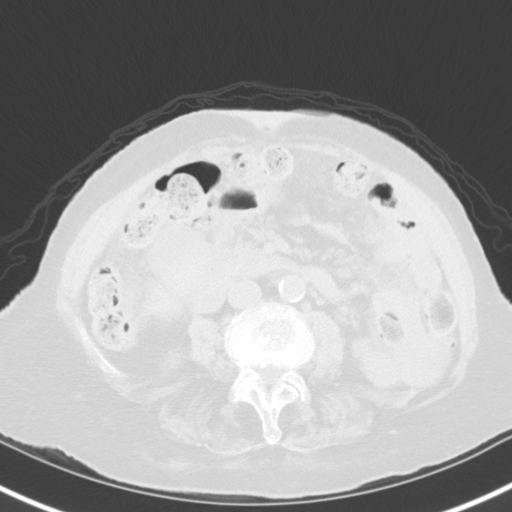
[im 63/84  soft-tissue]
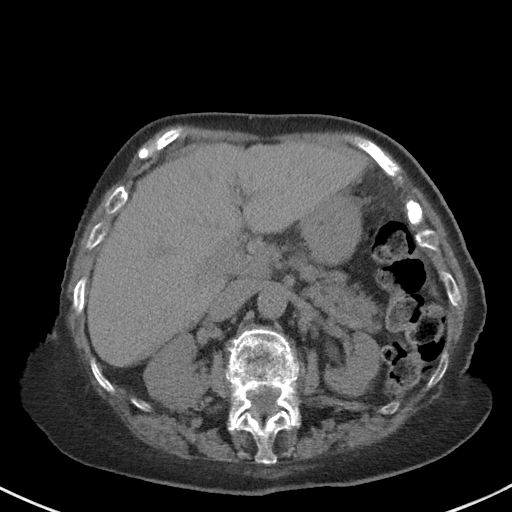
[im 63/84  lung]
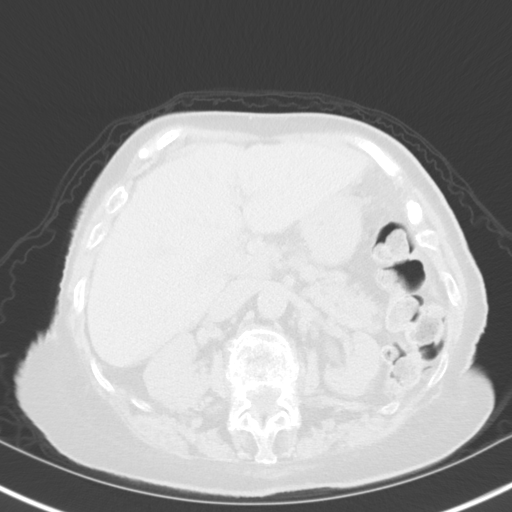
[im 73/84  soft-tissue]
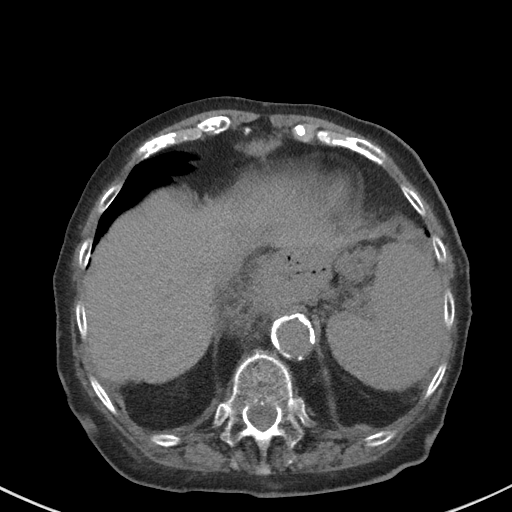
[im 73/84  lung]
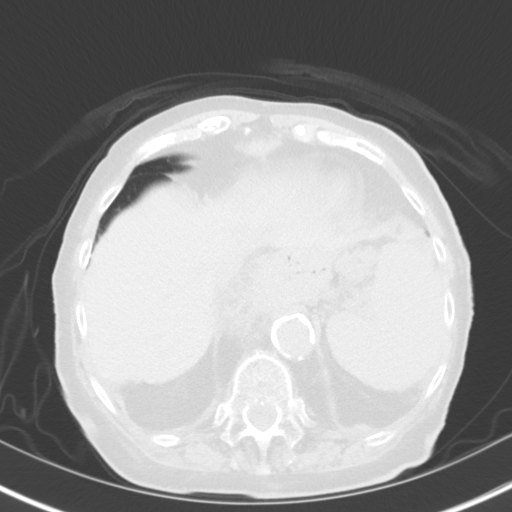

[Series 4: axial post · axial · 0.64mm/px · z∈[-953,-718]mm · 5 of 83 slices shown]
[im 12/83  soft-tissue]
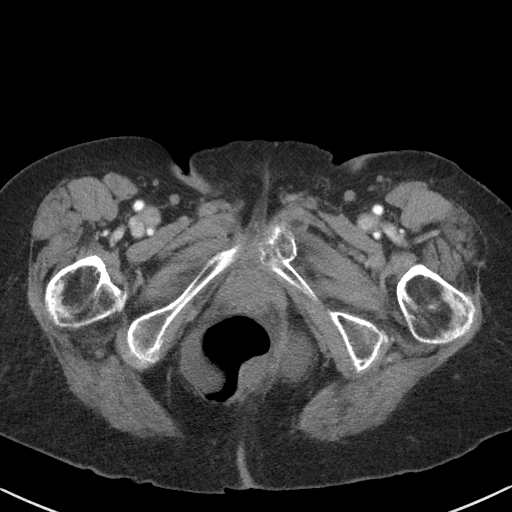
[im 24/83  soft-tissue]
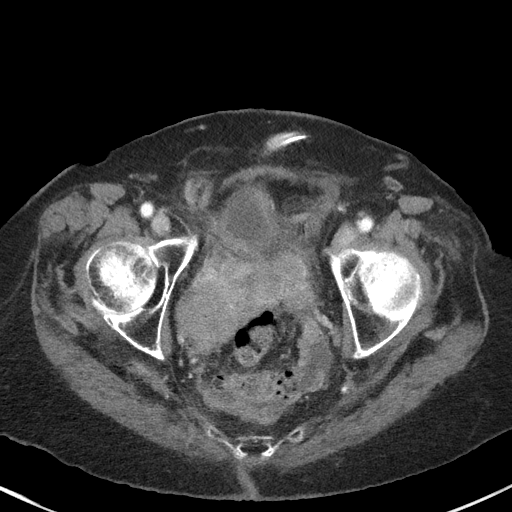
[im 36/83  soft-tissue]
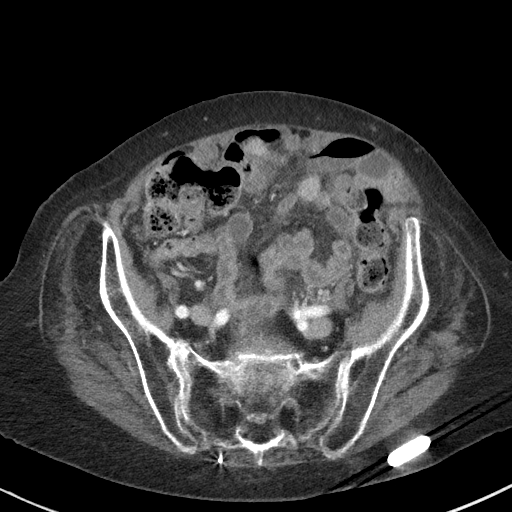
[im 47/83  soft-tissue]
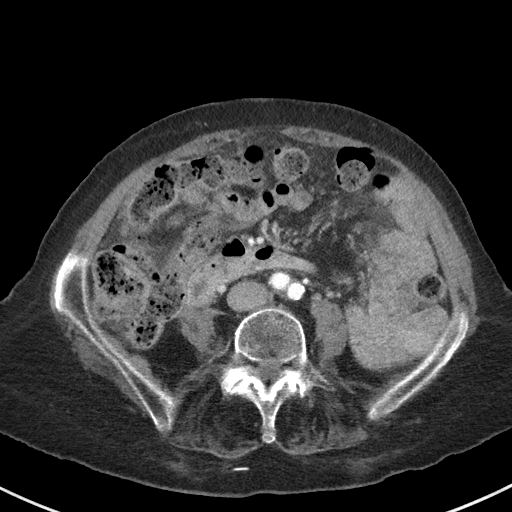
[im 59/83  soft-tissue]
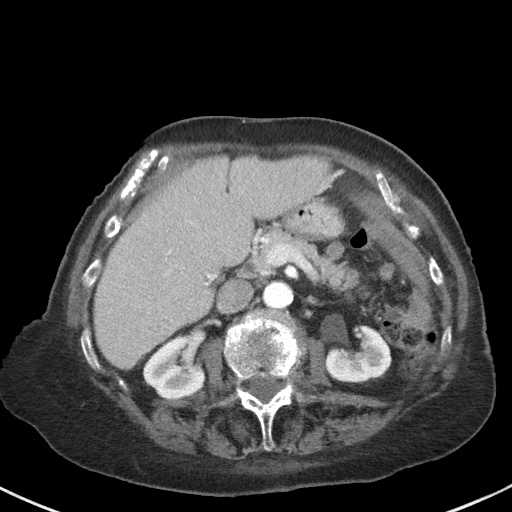

[12 of 32 positions shown; findings below may reference images not displayed]

FINDINGS: Lower chest: Small bilateral pleural effusions. There is a large
hiatal hernia. Soft tissue nodule versus lymph node identified
within the right CP angle fat measuring 2.9 x 1.2 cm.

Hepatobiliary: No focal liver abnormality. Gallbladder is
unremarkable. No biliary ductal dilatation.

Pancreas: No pancreatic inflammation or mass identified. Mild
increase caliber of the main pancreatic duct at the level of the
head measures up to 4.7 mm, image 33/4.

Spleen: Normal size spleen. There is a thin rind of low-attenuation
soft tissue within the splenic hilum with multiple centrally located
low-attenuation lesions measuring up to 1.7 cm, image [DATE].

Adrenals/Urinary Tract: Normal appearance of the adrenal glands. No
kidney mass or hydronephrosis identified. The urinary bladder is
collapsed around a suprapubic Foley catheter. No focal bladder
abnormality.

Stomach/Bowel: Large hiatal hernia. The appendix is not confidently
identified separate from the right lower quadrant bowel loops.
Extensive distal colonic diverticulosis identified without signs of
acute diverticulitis. No pathologic dilatation of the large or small
bowel loops.

Vascular/Lymphatic: Aortic atherosclerosis without aneurysm. Small
retroperitoneal and mesenteric lymph nodes are identified. No
abdominopelvic adenopathy.

Reproductive: The uterus is either surgically absent or atrophic.
Within the right adnexa there is a large mass which extends across
the midline of the pelvis. This measures 9.1 x 4.7 by 5.6 cm. The
left ovary contains several cysts and measures 5.1 x 2.0 by 3.5 cm.

Other: Signs of peritoneal carcinomatosis identified. Multiple soft
tissue nodules are noted within the abdomen and pelvis, including:

-lesion in the left upper quadrant of the abdomen measuring 2.8 x
2.1 cm, image [DATE].

-peritoneal nodule in the left posterior pelvis measures 1.1 x
cm, image 57/4.

-Peritoneal nodule within the ventral abdomen along the undersurface
of the abdominal wall measures 0.9 by 0.7 cm

Partially loculated ascites identified within the posterior pelvis.

There is a enhancing soft tissue nodule within the umbilicus which
measures 1.7 x 1.1 cm, image 65/9.

Musculoskeletal: Remote healed left pubic bone fracture. Multilevel
degenerative disc disease noted. 6 mm anterolisthesis of L5 on S1
noted.
IMPRESSION: 1. There is a large mass within the right side of pelvis extending
across the midline. This may be of ovarian or uterine origin.
Suspicious for malignancy.
2. Signs of peritoneal carcinomatosis with multiple peritoneal
nodules none in the abdomen and pelvis. Partially loculated fluid
noted within the posterior pelvis.
3. Enhancing nodule within the umbilicus is identified and likely
represents a metastatic lesion.
4. Suspect nodal metastasis to the right CP angle.
5. Large hiatal hernia.
6. Small bilateral pleural effusions.
7. Mild increase caliber of the main pancreatic duct at the level of
the head. No obstructing stone or mass identified.
8. Aortic atherosclerosis.

Aortic Atherosclerosis ([id number]).

These results were called by telephone at the time of interpretation
acknowledged these results.
